# Patient Record
Sex: Male | Born: 1985 | Race: Black or African American | Hispanic: No | Marital: Single | Smoking: Current every day smoker
Health system: Southern US, Community
[De-identification: ages and names within clinical notes are randomized; demographics above are authoritative.]

## PROBLEM LIST (undated history)

## (undated) DIAGNOSIS — J45909 Unspecified asthma, uncomplicated: Secondary | ICD-10-CM

## (undated) HISTORY — PX: HERNIA REPAIR: SHX51

---

## 2016-09-23 ENCOUNTER — Emergency Department (HOSPITAL_COMMUNITY)
Admission: EM | Admit: 2016-09-23 | Discharge: 2016-09-23 | Disposition: A | Discharge status: Home / Self Care | Payer: Self-pay | Attending: Emergency Medicine | Admitting: Emergency Medicine

## 2016-09-23 ENCOUNTER — Emergency Department (HOSPITAL_COMMUNITY): Payer: Self-pay

## 2016-09-23 DIAGNOSIS — R0602 Shortness of breath: Secondary | ICD-10-CM | POA: Insufficient documentation

## 2016-09-23 LAB — CBC
HCT: 40.7 % (ref 39.0–52.0)
Hemoglobin: 13.2 g/dL (ref 13.0–17.0)
MCH: 28.7 pg (ref 26.0–34.0)
MCHC: 32.4 g/dL (ref 30.0–36.0)
MCV: 88.5 fL (ref 78.0–100.0)
PLATELETS: 422 10*3/uL — AB (ref 150–400)
RBC: 4.6 MIL/uL (ref 4.22–5.81)
RDW: 12.7 % (ref 11.5–15.5)
WBC: 9.9 10*3/uL (ref 4.0–10.5)

## 2016-09-23 LAB — BASIC METABOLIC PANEL
Anion gap: 12 (ref 5–15)
BUN: 9 mg/dL (ref 6–20)
CALCIUM: 9.3 mg/dL (ref 8.9–10.3)
CO2: 22 mmol/L (ref 22–32)
CREATININE: 1.18 mg/dL (ref 0.61–1.24)
Chloride: 101 mmol/L (ref 101–111)
GFR calc non Af Amer: >60 mL/min (ref >60)
Glucose, Bld: 116 mg/dL — ABNORMAL HIGH (ref 65–99)
Potassium: 3.1 mmol/L — ABNORMAL LOW (ref 3.5–5.1)
SODIUM: 135 mmol/L (ref 135–145)

## 2016-09-23 LAB — I-STAT TROPONIN, ED: TROPONIN I, POC: 0 ng/mL (ref 0.00–0.08)

## 2016-09-23 MED ORDER — ALBUTEROL SULFATE HFA 108 (90 BASE) MCG/ACT IN AERS
2.0000 | INHALATION_SPRAY | RESPIRATORY_TRACT | Status: DC | PRN
  Administered 2016-09-23: 2 via RESPIRATORY_TRACT
  Filled 2016-09-23: qty 6.7

## 2016-09-23 MED ORDER — IOPAMIDOL (ISOVUE-370) INJECTION 76%
INTRAVENOUS | Status: AC
  Administered 2016-09-23: 100 mL via INTRAVENOUS
  Filled 2016-09-23: qty 100

## 2016-09-23 NOTE — ED Notes (Signed)
Patient still waiting for CT.

## 2016-09-23 NOTE — Discharge Instructions (Signed)
Albuterol inhaler: 2 puffs every 4 hours as needed for wheezing or difficulty breathing.  Follow-up with your primary Dr. if not improving in the next week, and return to the ER if your symptoms significantly worsen or change.
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
## 2016-09-23 NOTE — ED Provider Notes (Signed)
[facility]EM[facility]ENCY DEPT Provider Note   CSN: [id number] Arrival date & time: 09/23/16  0130     History   Chief Complaint Chief Complaint  Patient presents with  . Chest Pain    HPI Alex Ortiz is a 31 y.o. male.  Patient is a 31 year old male with no significant past medical history. He presents today for evaluation of left-sided chest pain. This started yesterday evening in the absence of any injury or trauma. He tells me he developed shortness of breath to the point where he was on all fours on the floor gasping for air and struggling to breathe. He denies any fevers or chills. He denies any productive cough.   The history is provided by the patient.  Chest Pain   This is a new problem. The current episode started 3 to 5 hours ago. The problem occurs constantly. The problem has been gradually improving. The pain is present in the lateral region. The pain is moderate. The quality of the pain is described as sharp. Associated symptoms include shortness of breath. Pertinent negatives include no cough and no fever. He has tried nothing for the symptoms.    No past medical history on file.  There are no active problems to display for this patient.   No past surgical history on file.     Home Medications    Prior to Admission medications   Not on File    Family History No family history on file.  Social History Social History  Substance Use Topics  . Smoking status: Not on file  . Smokeless tobacco: Not on file  . Alcohol use Not on file     Allergies   Patient has no allergy information on record.   Review of Systems Review of Systems  Constitutional: Negative for fever.  Respiratory: Positive for shortness of breath. Negative for cough.   Cardiovascular: Positive for chest pain.  All other systems reviewed and are negative.    Physical Exam Updated Vital Signs BP 134/78   Pulse 63   Temp 98.7 F (37.1 C) (Oral)   Resp 12   SpO2 95%   Physical  Exam  Constitutional: He is oriented to person, place, and time. He appears well-developed and well-nourished. No distress.  HENT:  Head: Normocephalic and atraumatic.  Mouth/Throat: Oropharynx is clear and moist.  Neck: Normal range of motion. Neck supple.  Cardiovascular: Normal rate and regular rhythm.  Exam reveals no friction rub.   No murmur heard. Pulmonary/Chest: Effort normal and breath sounds normal. No respiratory distress. He has no wheezes. He has no rales. He exhibits tenderness.  There is tenderness to palpation of the left lateral chest wall and left anterior chest wall. There is no crepitus.  Abdominal: Soft. Bowel sounds are normal. He exhibits no distension. There is no tenderness.  Musculoskeletal: Normal range of motion. He exhibits no edema.  Neurological: He is alert and oriented to person, place, and time. Coordination normal.  Skin: Skin is warm and dry. He is not diaphoretic.  Nursing note and vitals reviewed.    ED Treatments / Results  Labs (all labs ordered are listed, but only abnormal results are displayed) Labs Reviewed  BASIC METABOLIC PANEL - Abnormal; Notable for the following:       Result Value   Potassium 3.1 (*)    Glucose, Bld 116 (*)    All other components within normal limits  CBC - Abnormal; Notable for the following:    Platelets 422 (*)  All other components within normal limits  I-STAT TROPOININ, ED    EKG  EKG Interpretation  Date/Time:  Thursday September 23 2016 01:34:14 EDT Ventricular Rate:  81 PR Interval:  148 QRS Duration: 86 QT Interval:  364 QTC Calculation: 422 R Axis:   57 Text Interpretation:  Normal sinus rhythm Septal infarct , age undetermined Abnormal ECG Confirmed by Demika Langenderfer  MD, Corbitt Cloke ([id number]) on 09/23/2016 4:27:30 AM       Radiology Dg Chest 2 View  Result Date: 09/23/2016 CLINICAL DATA:  Chest pain and dyspnea x1 day EXAM: CHEST  2 VIEW COMPARISON:  None. FINDINGS: The heart size and mediastinal contours  are within normal limits. Both lungs are clear. The visualized skeletal structures are unremarkable. IMPRESSION: No active cardiopulmonary disease. Electronically Signed   By: Tollie Eth M.D.   On: 09/23/2016 02:42    Procedures Procedures (including critical care time)  Medications Ordered in ED Medications - No data to display   Initial Impression / Assessment and Plan / ED Course  I have reviewed the triage vital signs and the nursing notes.  Pertinent labs & imaging results that were available during my care of the patient were reviewed by me and considered in my medical decision making (see chart for details).  Patient presents here with complaints of chest pain and shortness of breath that started earlier this evening. He reports at one point he was on the floor, "gasping for air". He appears quite comfortable now with no tachycardia, no hypoxia, and EKG and troponin which are negative. His chest x-ray is clear. Due to the severity of patient's reported symptoms, he has undergone a CT scan of the chest. This shows no evidence of pulmonary embolism. At this point, he will be discharged with an albuterol inhaler should his symptoms recur and when necessary follow-up.  Final Clinical Impressions(s) / ED Diagnoses   Final diagnoses:  None    New Prescriptions New Prescriptions   No medications on file     Geoffery Lyons, MD 09/23/16 0800

## 2016-09-23 NOTE — ED Notes (Signed)
Patient to CT.
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
## 2016-09-29 ENCOUNTER — Emergency Department (HOSPITAL_COMMUNITY)
Admission: EM | Admit: 2016-09-29 | Discharge: 2016-09-29 | Disposition: A | Discharge status: Home / Self Care | Payer: No Typology Code available for payment source | Attending: Emergency Medicine | Admitting: Emergency Medicine

## 2016-09-29 ENCOUNTER — Emergency Department (HOSPITAL_COMMUNITY): Payer: No Typology Code available for payment source

## 2016-09-29 DIAGNOSIS — Y939 Activity, unspecified: Secondary | ICD-10-CM | POA: Insufficient documentation

## 2016-09-29 DIAGNOSIS — Y92410 Unspecified street and highway as the place of occurrence of the external cause: Secondary | ICD-10-CM | POA: Diagnosis not present

## 2016-09-29 DIAGNOSIS — M542 Cervicalgia: Secondary | ICD-10-CM | POA: Diagnosis not present

## 2016-09-29 DIAGNOSIS — Y999 Unspecified external cause status: Secondary | ICD-10-CM | POA: Insufficient documentation

## 2016-09-29 DIAGNOSIS — S0990XA Unspecified injury of head, initial encounter: Secondary | ICD-10-CM | POA: Diagnosis not present

## 2016-09-29 DIAGNOSIS — M7918 Myalgia, other site: Secondary | ICD-10-CM

## 2016-09-29 MED ORDER — METHOCARBAMOL 500 MG PO TABS: 500.0000 mg | ORAL_TABLET | Freq: Two times a day (BID) | ORAL | 0 refills | Status: DC

## 2016-09-29 MED ORDER — IBUPROFEN 600 MG PO TABS: 600.0000 mg | ORAL_TABLET | Freq: Four times a day (QID) | ORAL | 0 refills | Status: DC | PRN

## 2016-09-29 MED ORDER — IBUPROFEN 400 MG PO TABS
600.0000 mg | ORAL_TABLET | Freq: Once | ORAL | Status: AC
  Administered 2016-09-29: 07:00:00 600 mg via ORAL
  Filled 2016-09-29: qty 1

## 2016-09-29 MED ORDER — METHOCARBAMOL 500 MG PO TABS
500.0000 mg | ORAL_TABLET | Freq: Once | ORAL | Status: AC
  Administered 2016-09-29: 500 mg via ORAL
  Filled 2016-09-29: qty 1
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
## 2016-09-29 NOTE — ED Notes (Signed)
Patient transported to CT SCAN . 

## 2017-04-18 DIAGNOSIS — Z5321 Procedure and treatment not carried out due to patient leaving prior to being seen by health care provider: Secondary | ICD-10-CM | POA: Insufficient documentation

## 2017-04-19 ENCOUNTER — Emergency Department (HOSPITAL_COMMUNITY)
Admission: EM | Admit: 2017-04-19 | Discharge: 2017-04-19 | Disposition: A | Discharge status: Home / Self Care | Payer: Self-pay | Attending: Emergency Medicine | Admitting: Emergency Medicine

## 2017-04-19 ENCOUNTER — Encounter (HOSPITAL_COMMUNITY): Payer: Self-pay | Admitting: Emergency Medicine

## 2017-04-19 HISTORY — DX: Unspecified asthma, uncomplicated: J45.909

## 2017-04-19 NOTE — ED Notes (Signed)
Pt up to nurse first desk stating he is leaving. LWBS after triage

## 2017-04-19 NOTE — ED Triage Notes (Signed)
Pt reports body aches, HA, chills, nasal congestion X3 days.

## 2018-10-13 ENCOUNTER — Inpatient Hospital Stay (HOSPITAL_COMMUNITY)
Admission: EM | Admit: 2018-10-13 | Discharge: 2018-10-19 | DRG: 246 | Disposition: A | Discharge status: Home / Self Care | Payer: Self-pay | Attending: Cardiovascular Disease | Admitting: Cardiovascular Disease

## 2018-10-13 ENCOUNTER — Other Ambulatory Visit: Payer: Self-pay

## 2018-10-13 ENCOUNTER — Emergency Department (HOSPITAL_COMMUNITY): Payer: Self-pay

## 2018-10-13 ENCOUNTER — Encounter (HOSPITAL_COMMUNITY): Payer: Self-pay | Admitting: Emergency Medicine

## 2018-10-13 DIAGNOSIS — J45909 Unspecified asthma, uncomplicated: Secondary | ICD-10-CM | POA: Diagnosis present

## 2018-10-13 DIAGNOSIS — Z8249 Family history of ischemic heart disease and other diseases of the circulatory system: Secondary | ICD-10-CM

## 2018-10-13 DIAGNOSIS — I25110 Atherosclerotic heart disease of native coronary artery with unstable angina pectoris: Secondary | ICD-10-CM | POA: Diagnosis present

## 2018-10-13 DIAGNOSIS — I214 Non-ST elevation (NSTEMI) myocardial infarction: Principal | ICD-10-CM | POA: Diagnosis present

## 2018-10-13 DIAGNOSIS — I5021 Acute systolic (congestive) heart failure: Secondary | ICD-10-CM | POA: Diagnosis present

## 2018-10-13 DIAGNOSIS — R778 Other specified abnormalities of plasma proteins: Secondary | ICD-10-CM

## 2018-10-13 DIAGNOSIS — Z1159 Encounter for screening for other viral diseases: Secondary | ICD-10-CM

## 2018-10-13 DIAGNOSIS — Z955 Presence of coronary angioplasty implant and graft: Secondary | ICD-10-CM

## 2018-10-13 DIAGNOSIS — E785 Hyperlipidemia, unspecified: Secondary | ICD-10-CM | POA: Diagnosis present

## 2018-10-13 DIAGNOSIS — I208 Other forms of angina pectoris: Secondary | ICD-10-CM | POA: Diagnosis present

## 2018-10-13 DIAGNOSIS — R079 Chest pain, unspecified: Secondary | ICD-10-CM | POA: Diagnosis present

## 2018-10-13 DIAGNOSIS — I2510 Atherosclerotic heart disease of native coronary artery without angina pectoris: Secondary | ICD-10-CM | POA: Diagnosis present

## 2018-10-13 DIAGNOSIS — F17210 Nicotine dependence, cigarettes, uncomplicated: Secondary | ICD-10-CM | POA: Diagnosis present

## 2018-10-13 LAB — BASIC METABOLIC PANEL
Anion gap: 8 (ref 5–15)
BUN: 13 mg/dL (ref 6–20)
CO2: 26 mmol/L (ref 22–32)
Calcium: 9.5 mg/dL (ref 8.9–10.3)
Chloride: 105 mmol/L (ref 98–111)
Creatinine, Ser: 1.13 mg/dL (ref 0.61–1.24)
GFR calc Af Amer: >60 mL/min (ref >60)
GFR calc non Af Amer: >60 mL/min (ref >60)
Glucose, Bld: 122 mg/dL — ABNORMAL HIGH (ref 70–99)
Potassium: 3.9 mmol/L (ref 3.5–5.1)
Sodium: 139 mmol/L (ref 135–145)

## 2018-10-13 LAB — TROPONIN I
Troponin I: 0.03 ng/mL (ref <0.03)
Troponin I: 0.09 ng/mL (ref <0.03)

## 2018-10-13 LAB — CBC
HCT: 45.4 % (ref 39.0–52.0)
Hemoglobin: 14.2 g/dL (ref 13.0–17.0)
MCH: 28.7 pg (ref 26.0–34.0)
MCHC: 31.3 g/dL (ref 30.0–36.0)
MCV: 91.9 fL (ref 80.0–100.0)
Platelets: 440 10*3/uL — ABNORMAL HIGH (ref 150–400)
RBC: 4.94 MIL/uL (ref 4.22–5.81)
RDW: 12.4 % (ref 11.5–15.5)
WBC: 5.4 10*3/uL (ref 4.0–10.5)
nRBC: 0 % (ref 0.0–0.2)

## 2018-10-13 LAB — SARS CORONAVIRUS 2 BY RT PCR (HOSPITAL ORDER, PERFORMED IN ~~LOC~~ HOSPITAL LAB): SARS Coronavirus 2: NEGATIVE

## 2018-10-13 LAB — RAPID URINE DRUG SCREEN, HOSP PERFORMED
Amphetamines: NOT DETECTED
Barbiturates: NOT DETECTED
Benzodiazepines: NOT DETECTED
Cocaine: NOT DETECTED
Opiates: NOT DETECTED
Tetrahydrocannabinol: POSITIVE — AB

## 2018-10-13 MED ORDER — SODIUM CHLORIDE 0.9% FLUSH
3.0000 mL | Freq: Once | INTRAVENOUS | Status: AC
  Administered 2018-10-13: 3 mL via INTRAVENOUS

## 2018-10-13 MED ORDER — FENTANYL CITRATE (PF) 100 MCG/2ML IJ SOLN
50.0000 ug | Freq: Once | INTRAMUSCULAR | Status: AC
  Administered 2018-10-13: 50 ug via INTRAVENOUS
  Filled 2018-10-13: qty 2

## 2018-10-13 MED ORDER — IOHEXOL 350 MG/ML SOLN
100.0000 mL | Freq: Once | INTRAVENOUS | Status: AC | PRN
  Administered 2018-10-13: 100 mL via INTRAVENOUS

## 2018-10-13 MED ORDER — MORPHINE SULFATE (PF) 4 MG/ML IV SOLN
4.0000 mg | Freq: Once | INTRAVENOUS | Status: AC
  Administered 2018-10-13: 18:00:00 4 mg via INTRAVENOUS
  Filled 2018-10-13: qty 1

## 2018-10-13 MED ORDER — LIDOCAINE VISCOUS HCL 2 % MT SOLN
15.0000 mL | Freq: Once | OROMUCOSAL | Status: AC
  Administered 2018-10-13: 15 mL via ORAL
  Filled 2018-10-13: qty 15

## 2018-10-13 MED ORDER — KETOROLAC TROMETHAMINE 30 MG/ML IJ SOLN
30.0000 mg | Freq: Once | INTRAMUSCULAR | Status: AC
  Administered 2018-10-13: 30 mg via INTRAVENOUS
  Filled 2018-10-13: qty 1

## 2018-10-13 MED ORDER — ALUM & MAG HYDROXIDE-SIMETH 200-200-20 MG/5ML PO SUSP
30.0000 mL | Freq: Once | ORAL | Status: AC
  Administered 2018-10-13: 30 mL via ORAL
  Filled 2018-10-13: qty 30

## 2018-10-13 MED ORDER — FAMOTIDINE 20 MG PO TABS
20.0000 mg | ORAL_TABLET | Freq: Once | ORAL | Status: AC
  Administered 2018-10-13: 20 mg via ORAL
  Filled 2018-10-13: qty 1

## 2018-10-13 MED ORDER — ASPIRIN 325 MG PO TABS
325.0000 mg | ORAL_TABLET | Freq: Once | ORAL | Status: AC
  Administered 2018-10-13: 325 mg via ORAL
  Filled 2018-10-13: qty 1

## 2018-10-13 NOTE — ED Notes (Signed)
Patient transported to x-ray. ?
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
## 2018-10-13 NOTE — ED Notes (Signed)
Pt given water 

## 2018-10-13 NOTE — ED Notes (Signed)
EDP made aware of troponin level

## 2018-10-13 NOTE — ED Provider Notes (Signed)
[facility] EMERGENCY DEPARTMENT Provider Note   CSN: [id number] Arrival date & time: 10/13/18  1639    History   Chief Complaint Chief Complaint  Patient presents with   Chest Pain    HPI Alex Ortiz is a 33 y.o. male.     Alex Ortiz is a 33 y.o. male with a history of asthma, otherwise healthy, who presents to the emergency department for evaluation of chest pain.  Patient reports that chest pain started suddenly this morning while he was playing video games.  He reports pain is a sharp pain present over the middle and left side of the chest.  It is constant in nature.  It is nonradiating.  Pain is not worse with exertion.  He does report pain seems to worsen with movement but not with particular position.  He denies any numbness or weakness in any of his extremities.  No associated abdominal pain, nausea vomiting or diaphoresis.  No similar episodes of chest pain.  He does report recent exercise and weight lifting, most recently last night, but does not have generalized myalgias elsewhere.  Pain is only localized to the chest.  He denies any personal history of cardiac issues.  Family history of CAD but only in older family members.  He denies cocaine use, admits to marijuana but denies other drugs.  Reports occasional alcohol use.  No other cardiac risk factors.     Past Medical History:  Diagnosis Date   Asthma     There are no active problems to display for this patient.   Past Surgical History:  Procedure Laterality Date   HERNIA REPAIR          Home Medications    Prior to Admission medications   Not on File    Family History No family history on file.  Social History Social History   Tobacco Use   Smoking status: Current Every Day Smoker   Smokeless tobacco: Never Used  Substance Use Topics   Alcohol use: Yes    Comment: socially    Drug use: No     Allergies   Patient has no known allergies.   Review of  Systems Review of Systems  Constitutional: Negative for chills and fever.  HENT: Negative.   Eyes: Negative for visual disturbance.  Respiratory: Negative for cough, chest tightness, shortness of breath and wheezing.   Cardiovascular: Positive for chest pain. Negative for palpitations and leg swelling.  Gastrointestinal: Negative for abdominal pain, nausea and vomiting.  Genitourinary: Negative for dysuria.  Musculoskeletal: Negative for arthralgias and back pain.  Skin: Negative for color change and rash.  Neurological: Negative for dizziness, syncope and light-headedness.     Physical Exam Updated Vital Signs BP 128/74 (BP Location: Right Arm)    Pulse 76    Temp 99.1 F (37.3 C) (Oral)    Resp 16    Ht  (1.727 m)    Wt 84.8 kg    SpO2 98%    BMI 28.43 kg/m   Physical Exam Vitals signs and nursing note reviewed.  Constitutional:      General: He is not in acute distress.    Appearance: He is well-developed and normal weight. He is not diaphoretic.     Comments: Patient appears uncomfortable but is in no acute distress, smells of marijuana  HENT:     Head: Normocephalic and atraumatic.  Eyes:     General:        Right eye: No  discharge.        Left eye: No discharge.     Pupils: Pupils are equal, round, and reactive to light.  Neck:     Musculoskeletal: Neck supple.  Cardiovascular:     Rate and Rhythm: Normal rate and regular rhythm.     Pulses:          Radial pulses are 2+ on the right side and 2+ on the left side.       Dorsalis pedis pulses are 2+ on the right side and 2+ on the left side.     Heart sounds: Normal heart sounds. No murmur. No friction rub. No gallop.   Pulmonary:     Effort: Pulmonary effort is normal. No respiratory distress.     Breath sounds: Normal breath sounds. No wheezing or rales.     Comments: Respirations equal and unlabored, patient able to speak in full sentences, lungs clear to auscultation bilaterally Chest:     Comments: Chest  pain is not reproducible with palpation.  No overlying skin changes or deformity noted over the chest wall. Abdominal:     General: Bowel sounds are normal. There is no distension.     Palpations: Abdomen is soft. There is no mass.     Tenderness: There is no abdominal tenderness. There is no guarding.     Comments: Abdomen soft, nondistended, nontender to palpation in all quadrants without guarding or peritoneal signs  Musculoskeletal:        General: No deformity.     Right lower leg: He exhibits no tenderness. No edema.     Left lower leg: He exhibits no tenderness. No edema.  Skin:    General: Skin is warm and dry.     Capillary Refill: Capillary refill takes less than 2 seconds.  Neurological:     Mental Status: He is alert.     Coordination: Coordination normal.     Comments: Speech is clear, able to follow commands CN III-XII intact Normal strength in upper and lower extremities bilaterally including dorsiflexion and plantar flexion, strong and equal grip strength Sensation normal to light and sharp touch Moves extremities without ataxia, coordination intact  Psychiatric:        Mood and Affect: Mood normal.        Behavior: Behavior normal.      ED Treatments / Results  Labs (all labs ordered are listed, but only abnormal results are displayed) Labs Reviewed  BASIC METABOLIC PANEL - Abnormal; Notable for the following components:      Result Value   Glucose, Bld 122 (*)    All other components within normal limits  CBC - Abnormal; Notable for the following components:   Platelets 440 (*)    All other components within normal limits  TROPONIN I - Abnormal; Notable for the following components:   Troponin I 0.03 (*)    All other components within normal limits  RAPID URINE DRUG SCREEN, HOSP PERFORMED - Abnormal; Notable for the following components:   Tetrahydrocannabinol POSITIVE (*)    All other components within normal limits  TROPONIN I - Abnormal; Notable for  the following components:   Troponin I 0.09 (*)    All other components within normal limits  SARS CORONAVIRUS 2 ([facility]PITAL [facility])  TROPONIN I    EKG EKG Interpretation  Date/Time:  Friday Oct 13 2018 16:44:40 EDT Ventricular Rate:  83 PR Interval:  126 QRS Duration: 90 QT Interval:  346 QTC Calculation: 406 R Axis:   41 Text Interpretation:  Normal sinus rhythm with sinus arrhythmia Poor baseline Confirmed by Blane Ohara [phone]) on 10/13/2018 5:14:31 PM   Radiology Dg Chest 2 View  Result Date: 10/13/2018 CLINICAL DATA:  Chest pain. EXAM: CHEST - 2 VIEW COMPARISON:  Chest x-ray and CT chest dated September 23, 2016. FINDINGS: The heart size and mediastinal contours are within normal limits. Both lungs are clear. The visualized skeletal structures are unremarkable. IMPRESSION: No active cardiopulmonary disease. Electronically Signed   By: Obie Dredge M.D.   On: 10/13/2018 19:19   Ct Angio Chest/abd/pel For Dissection W And/or Wo Contrast  Result Date: 10/13/2018 CLINICAL DATA:  Chest and abdomen pain EXAM: CT ANGIOGRAPHY CHEST, ABDOMEN AND PELVIS TECHNIQUE: Initially, axial CT images were obtained through the chest without intravenous contrast material administration. Multidetector CT imaging through the chest, abdomen and pelvis was performed using the standard protocol during bolus administration of intravenous contrast. Multiplanar reconstructed images and MIPs were obtained and reviewed to evaluate the vascular anatomy. CONTRAST:  OMNIPAQUE IOHEXOL 350 MG/ML SOLN COMPARISON:  Chest CT September 23, 2016; chest radiograph Oct 13, 2018 FINDINGS: CTA CHEST FINDINGS Cardiovascular: There is no intramural hematoma within the thoracic aorta on the noncontrast enhanced study. There is no demonstrable thoracic aortic aneurysm or dissection. The visualized great vessels appear unremarkable. Note that the right innominate and left common carotid arteries  arise as a common trunk, an anatomic variant. No pulmonary embolus is appreciable. There is no pericardial effusion or pericardial thickening evident. Mediastinum/Nodes: Thyroid appears unremarkable. There is an enlarged right hilar lymph node measuring 1.4 x 1.4 cm. There are scattered subcentimeter mediastinal lymph nodes and axillary lymph nodes. No other lymph node enlargement is evident. There is a small hiatal hernia. Lungs/Pleura: There is mild bibasilar atelectasis. No pneumothorax. No edema or consolidation. No pleural effusion or pleural thickening evident. Musculoskeletal: There are no blastic or lytic bone lesions. No evident fracture or dislocation. No chest wall lesions evident. Review of the MIP images confirms the above findings. CTA ABDOMEN AND PELVIS FINDINGS VASCULAR Aorta: There is no abdominal aortic aneurysm or dissection. No appreciable atherosclerotic change. Celiac: Celiac artery and its branches appear widely patent. No aneurysm or dissection evident. SMA: Superior mesenteric artery and its branches appear widely patent. No aneurysm or dissection evident. Renals: There are 2 renal arteries on each side, with one of the arteries on each side significantly larger than the other. Renal arteries bilaterally appear widely patent in all segments. No aneurysm or dissection. No fibromuscular dysplasia evident on either side. IMA: Inferior mesenteric artery in its branches are widely patent. No aneurysm or dissection evident. Inflow: The major pelvic arterial vessels are widely patent throughout their respective courses. No appreciable atherosclerotic change noted. No aneurysm or dissection involving major pelvic arterial vessels. Proximal superficial and profunda femoral arteries are also widely patent without aneurysm or dissection. Veins: No obvious venous abnormality within the limitations of this arterial phase study. Review of the MIP images confirms the above findings. NON-VASCULAR  Hepatobiliary: No focal liver lesions are evident. The gallbladder wall is not appreciably thickened. There is no biliary duct dilatation. Pancreas: There is no pancreatic mass or inflammatory focus. Spleen: No splenic lesions are evident. Adrenals/Urinary Tract: Adrenals bilaterally appear normal. Kidneys bilaterally show no evident mass or hydronephrosis on either side. There is no appreciable renal or ureteral calculus. Urinary bladder is midline with wall thickness within normal limits. Stomach/Bowel: There is no appreciable  bowel wall or mesenteric thickening. No evident bowel obstruction. Terminal ileum appears unremarkable. No free air or portal venous air evident. Lymphatic: There is no evident adenopathy in the abdomen or pelvis. Reproductive: Prostate and seminal vesicles appear normal in size and contour. No evident pelvic mass. Other: The appendix appears normal. There is no abscess or ascites in the abdomen or pelvis. Musculoskeletal: No evident fracture or dislocation. No blastic or lytic bone lesions. No intramuscular or abdominal wall lesions evident. Review of the MIP images confirms the above findings. IMPRESSION: CT angiogram chest: 1. No thoracic aortic aneurysm or dissection. No intramural hematoma. 2.  No demonstrable pulmonary embolus. 3.  Areas of mild atelectasis.  No lung edema or consolidation. 4. Enlarged right hilar lymph node. No other adenopathy evident. Etiology for this single enlarged lymph node is uncertain. 5.  Small hiatal hernia. CT angiogram abdomen; CT angiogram pelvis: 1. No aneurysm or dissection involving the aorta, major pelvic, or major mesenteric vessels. No appreciable obstructive disease. No appreciable atherosclerotic calcification. No fibromuscular dysplasia. 2. No evident bowel wall thickening or bowel obstruction. No abscess in the abdomen pelvis. Appendix appears normal. 3. No renal or ureteral calculus. No hydronephrosis on either side. Urinary bladder wall  thickness is within normal limits. Electronically Signed   By: Bretta Bang III M.D.   On: 10/13/2018 19:57    Procedures Procedures (including critical care time)  Medications Ordered in ED Medications  sodium chloride flush (NS) 0.9 % injection 3 mL (3 mLs Intravenous Given 10/13/18 1747)  morphine 4 MG/ML injection 4 mg (4 mg Intravenous Given 10/13/18 1802)  aspirin tablet 325 mg (325 mg Oral Given 10/13/18 1804)  alum & mag hydroxide-simeth (MAALOX/MYLANTA) 200-200-20 MG/5ML suspension 30 mL (30 mLs Oral Given 10/13/18 1805)    And  lidocaine (XYLOCAINE) 2 % viscous mouth solution 15 mL (15 mLs Oral Given 10/13/18 1805)  famotidine (PEPCID) tablet 20 mg (20 mg Oral Given 10/13/18 1804)  fentaNYL (SUBLIMAZE) injection 50 mcg (50 mcg Intravenous Given 10/13/18 1947)  iohexol (OMNIPAQUE) 350 MG/ML injection 100 mL (100 mLs Intravenous Contrast Given 10/13/18 1941)     Initial Impression / Assessment and Plan / ED Course  I have reviewed the triage vital signs and the nursing notes.  Pertinent labs & imaging results that were available during my care of the patient were reviewed by me and considered in my medical decision making (see chart for details).  Presents with central and left-sided chest pain which began this morning, described as sharp and constant.  Pain is nonradiating, pain is nonexertional.  He denies associated shortness of breath.  No lower extremity swelling.  Pain came on suddenly.  No associated shortness of breath, syncope, abdominal pain, nausea vomiting or diaphoresis.  Pain is fairly atypical and patient does not have significant risk factors for ACS.  He does appear uncomfortable.  Does not have obvious risk factors for dissection and does not have unilateral pulse differences but if pain is not controlled dissection should be considered.  I have extremely low suspicion for PE and patient is PERC negative.  He does not have any belly tenderness, he does report a burning  characteristic of his pain, wonder if GERD could be contributing.  He does report that he has recently been doing a lot of weight lifting but pain is not reproducible with palpation.  He does not have diffuse myalgias elsewhere to suggest rhabdo.  Will check basic labs, troponin, EKG and chest x-ray.  Morphine a  GI cocktail for pain as well as aspirin.  EKG shows normal sinus rhythm with sinus arrhythmia, initial troponin is minimally elevated at 0.03, given that story is so atypical we will plan to trend troponin.  After initial pain medications patient reports no improvement in his pain.  His labs are reassuring and his chest x-ray is clear but given persistent acute pain will get dissection study.  UDS is negative for cocaine.  Dissection study is unremarkable, no evidence of dissection or PE.  There is some right hilar lymphadenopathy, no other findings reported.  Patient second troponin despite reassuring work-up has continued to elevate and is now 0.09, case was discussed with Dr. Vallery Ridge, cardiology fellow who recommends that given atypical story that patient have third troponin run if this is within the range of the other 2 he can likely be discharged with close outpatient follow-up but if it continues to elevate patient will likely need to be admitted.  Third troponin returns at 0.31.  Discussed again with Dr. Charna Busman who will see the patient in consult but recommends medicine admission for continued trending of troponins and echo in the morning.  Does not recommend heparinizing at this time given atypical story.  Case discussed with Dr. Allena Katz with [facility] hospital[facility] who will see and admit the patient.  Final Clinical Impressions(s) / ED Diagnoses   Final diagnoses:  Central chest pain  Elevated troponin    ED Discharge Orders    None       Legrand Rams 10/14/18 0034    Tilden Fossa, MD 10/17/18 0730

## 2018-10-14 ENCOUNTER — Observation Stay (HOSPITAL_BASED_OUTPATIENT_CLINIC_OR_DEPARTMENT_OTHER): Payer: Self-pay

## 2018-10-14 ENCOUNTER — Encounter (HOSPITAL_COMMUNITY): Payer: Self-pay | Admitting: Internal Medicine

## 2018-10-14 DIAGNOSIS — E785 Hyperlipidemia, unspecified: Secondary | ICD-10-CM | POA: Diagnosis present

## 2018-10-14 DIAGNOSIS — R079 Chest pain, unspecified: Secondary | ICD-10-CM

## 2018-10-14 DIAGNOSIS — R7989 Other specified abnormal findings of blood chemistry: Secondary | ICD-10-CM

## 2018-10-14 DIAGNOSIS — R0789 Other chest pain: Secondary | ICD-10-CM

## 2018-10-14 DIAGNOSIS — R778 Other specified abnormalities of plasma proteins: Secondary | ICD-10-CM | POA: Insufficient documentation

## 2018-10-14 DIAGNOSIS — I214 Non-ST elevation (NSTEMI) myocardial infarction: Secondary | ICD-10-CM | POA: Diagnosis present

## 2018-10-14 DIAGNOSIS — J45909 Unspecified asthma, uncomplicated: Secondary | ICD-10-CM

## 2018-10-14 LAB — BASIC METABOLIC PANEL
Anion gap: 12 (ref 5–15)
BUN: 8 mg/dL (ref 6–20)
CO2: 23 mmol/L (ref 22–32)
Calcium: 9.5 mg/dL (ref 8.9–10.3)
Chloride: 101 mmol/L (ref 98–111)
Creatinine, Ser: 1.06 mg/dL (ref 0.61–1.24)
GFR calc Af Amer: >60 mL/min (ref >60)
GFR calc non Af Amer: >60 mL/min (ref >60)
Glucose, Bld: 108 mg/dL — ABNORMAL HIGH (ref 70–99)
Potassium: 3.9 mmol/L (ref 3.5–5.1)
Sodium: 136 mmol/L (ref 135–145)

## 2018-10-14 LAB — LIPID PANEL
Cholesterol: 186 mg/dL (ref 0–200)
HDL: 45 mg/dL (ref >40)
LDL Cholesterol: 127 mg/dL — ABNORMAL HIGH (ref 0–99)
Total CHOL/HDL Ratio: 4.1 RATIO
Triglycerides: 70 mg/dL (ref <150)
VLDL: 14 mg/dL (ref 0–40)

## 2018-10-14 LAB — CBC
HCT: 45.5 % (ref 39.0–52.0)
Hemoglobin: 14.8 g/dL (ref 13.0–17.0)
MCH: 29.2 pg (ref 26.0–34.0)
MCHC: 32.5 g/dL (ref 30.0–36.0)
MCV: 89.9 fL (ref 80.0–100.0)
Platelets: 412 10*3/uL — ABNORMAL HIGH (ref 150–400)
RBC: 5.06 MIL/uL (ref 4.22–5.81)
RDW: 12.3 % (ref 11.5–15.5)
WBC: 9.9 10*3/uL (ref 4.0–10.5)
nRBC: 0 % (ref 0.0–0.2)

## 2018-10-14 LAB — ECHOCARDIOGRAM COMPLETE
Height: 68 in
Weight: 2966.4 oz

## 2018-10-14 LAB — HIV ANTIBODY (ROUTINE TESTING W REFLEX): HIV Screen 4th Generation wRfx: NONREACTIVE

## 2018-10-14 LAB — TROPONIN I: Troponin I: 0.31 ng/mL (ref <0.03)

## 2018-10-14 MED ORDER — ONDANSETRON HCL 4 MG/2ML IJ SOLN: 4.0000 mg | Freq: Four times a day (QID) | INTRAMUSCULAR | Status: DC | PRN

## 2018-10-14 MED ORDER — PANTOPRAZOLE SODIUM 40 MG PO TBEC
40.0000 mg | DELAYED_RELEASE_TABLET | Freq: Every day | ORAL | Status: DC
  Administered 2018-10-14 – 2018-10-19 (×6): 40 mg via ORAL
  Filled 2018-10-14 (×6): qty 1

## 2018-10-14 MED ORDER — ENOXAPARIN SODIUM 40 MG/0.4ML ~~LOC~~ SOLN
40.0000 mg | Freq: Every day | SUBCUTANEOUS | Status: DC
  Administered 2018-10-14: 02:00:00 40 mg via SUBCUTANEOUS
  Filled 2018-10-14: qty 0.4

## 2018-10-14 MED ORDER — OXYCODONE-ACETAMINOPHEN 5-325 MG PO TABS
2.0000 | ORAL_TABLET | Freq: Once | ORAL | Status: AC
  Administered 2018-10-14: 2 via ORAL
  Filled 2018-10-14: qty 2

## 2018-10-14 MED ORDER — ALBUTEROL SULFATE (2.5 MG/3ML) 0.083% IN NEBU: 3.0000 mL | INHALATION_SOLUTION | Freq: Four times a day (QID) | RESPIRATORY_TRACT | Status: DC | PRN

## 2018-10-14 MED ORDER — ASPIRIN EC 81 MG PO TBEC
81.0000 mg | DELAYED_RELEASE_TABLET | Freq: Every day | ORAL | Status: DC
  Administered 2018-10-14 – 2018-10-19 (×6): 81 mg via ORAL
  Filled 2018-10-14 (×7): qty 1

## 2018-10-14 MED ORDER — ATORVASTATIN CALCIUM 40 MG PO TABS
40.0000 mg | ORAL_TABLET | Freq: Every day | ORAL | Status: DC
  Administered 2018-10-14 – 2018-10-15 (×2): 40 mg via ORAL
  Filled 2018-10-14 (×2): qty 1

## 2018-10-14 MED ORDER — ACETAMINOPHEN 325 MG PO TABS: 650.0000 mg | ORAL_TABLET | ORAL | Status: DC | PRN

## 2018-10-14 MED ORDER — HEPARIN (PORCINE) 25000 UT/250ML-% IV SOLN
1250.0000 [IU]/h | INTRAVENOUS | Status: DC
  Administered 2018-10-14: 1000 [IU]/h via INTRAVENOUS
  Administered 2018-10-15: 22:00:00 1250 [IU]/h via INTRAVENOUS
  Filled 2018-10-14 (×2): qty 250

## 2018-10-14 MED ORDER — HEPARIN BOLUS VIA INFUSION
2000.0000 [IU] | Freq: Once | INTRAVENOUS | Status: DC
  Filled 2018-10-14: qty 2000

## 2018-10-14 MED ORDER — METOPROLOL TARTRATE 12.5 MG HALF TABLET
12.5000 mg | ORAL_TABLET | Freq: Two times a day (BID) | ORAL | Status: DC
  Administered 2018-10-14 – 2018-10-18 (×9): 12.5 mg via ORAL
  Filled 2018-10-14 (×10): qty 1

## 2018-10-14 NOTE — Progress Notes (Signed)
Repeat EKG reviewed - there are now biphasic T waves in the anteroseptal leads with T wave inversion and ST flattening in the high lateral leads - consistent with evolving EKG pattern of MI. He remains chest pain free at this point. Continue medical therapy with plan for cath Monday.  Chrystie Nose, MD, [facility], FACP    [facility]  Medical Director of the [facility]  [facility] Diplomate of the American Board of Clinical Lipidology Attending Cardiologist  Direct Dial: [phone]  Fax: [phone]  Website:  [email or web address]

## 2018-10-14 NOTE — Progress Notes (Signed)
PROGRESS NOTE    Alex Ortiz  ZOX:[id number] DOB: December 19, 1985 DOA: 10/13/2018 PCP: Patient, No Pcp Per  Brief Narrative: 33 year old male with 3 of asthma presented to the emergency room with chest pain x1 day yesterday,  -EKG abnormal, troponin mildly elevated  Assessment & Plan:   Atypical chest pain/elevated troponin -Etiology not very clear at this time, pericarditis is a possibility -Troponin mildly elevated, trended up from 0.03->0.09 and then 0.31 -CTA with no PE, no dissection no acute pulmonary findings, isolated hilar lymph node of questionable significance -EKG with Q waves in V1 V2, LVH -Cardiology consulting, echocardiogram today -If echo normal plan to consider coronary CTA per cardiology -COVID-19 PCR was negative -Add PPI as well  History of asthma -No symptoms of asthma exacerbation, no cough congestion wheezing or shortness of breath -Albuterol as needed  DVT prophylaxis: Lovenox Code Status: Full code Family Communication: No family at bedside Disposition Plan: Home pending above cardiac work-up  Consultants:   Cardiology   Procedures:   Antimicrobials:    Subjective: -Had mild chest discomfort earlier this morning again -Denies heartburn  Objective: Vitals:   10/13/18 2230 10/13/18 2335 10/14/18 0128 10/14/18 0135  BP: (!) 136/98 116/82  (!) 134/99  Pulse: 68 63  64  Resp: 13 15    Temp:    98.2 F (36.8 C)  TempSrc:    Oral  SpO2: 99% 100%  99%  Weight:   84.1 kg   Height:    (1.727 m)     Intake/Output Summary (Last 24 hours) at 10/14/2018 1036 Last data filed at 10/14/2018 0130 Gross per 24 hour  Intake 120 ml  Output -  Net 120 ml   Filed Weights   10/13/18 1652 10/14/18 0128  Weight: 84.8 kg 84.1 kg    Examination:  General exam: Appears calm and comfortable, laying in bed, no distress Respiratory system: Decreased breath sounds at the left base, rest clear Cardiovascular system: S1 & S2 heard, RRR.  Gastrointestinal  system: Abdomen is nondistended, soft and nontender.Normal bowel sounds heard. Central nervous system: Alert and oriented. No focal neurological deficits. Extremities: No edema Skin: No rashes, lesions or ulcers Psychiatry: Judgement and insight appear normal. Mood & affect appropriate.     Data Reviewed:   CBC: Recent Labs  Lab 10/13/18 1657 10/14/18 0205  WBC 5.4 9.9  HGB 14.2 14.8  HCT 45.4 45.5  MCV 91.9 89.9  PLT 440* 412*   Basic Metabolic Panel: Recent Labs  Lab 10/13/18 1657 10/14/18 0205  NA 139 136  K 3.9 3.9  CL 105 101  CO2 26 23  GLUCOSE 122* 108*  BUN 13 8  CREATININE 1.13 1.06  CALCIUM 9.5 9.5   GFR: Estimated Creatinine Clearance: 106.7 mL/min (by C-G formula based on SCr of 1.06 mg/dL). Liver Function Tests: No results for input(s): AST, ALT, ALKPHOS, BILITOT, PROT, ALBUMIN in the last 168 hours. No results for input(s): LIPASE, AMYLASE in the last 168 hours. No results for input(s): AMMONIA in the last 168 hours. Coagulation Profile: No results for input(s): INR, PROTIME in the last 168 hours. Cardiac Enzymes: Recent Labs  Lab 10/13/18 1657 10/13/18 2007 10/13/18 2300  TROPONINI 0.03* 0.09* 0.31*   BNP (last 3 results) No results for input(s): PROBNP in the last 8760 hours. HbA1C: No results for input(s): HGBA1C in the last 72 hours. CBG: No results for input(s): GLUCAP in the last 168 hours. Lipid Profile: Recent Labs    10/14/18 0205  CHOL 186  HDL 45  LDLCALC 127*  TRIG 70  CHOLHDL 4.1   Thyroid Function Tests: No results for input(s): TSH, T4TOTAL, FREET4, T3FREE, THYROIDAB in the last 72 hours. Anemia Panel: No results for input(s): VITAMINB12, FOLATE, FERRITIN, TIBC, IRON, RETICCTPCT in the last 72 hours. Urine analysis: No results found for: COLORURINE, APPEARANCEUR, LABSPEC, PHURINE, GLUCOSEU, HGBUR, BILIRUBINUR, KETONESUR, PROTEINUR, UROBILINOGEN, NITRITE, LEUKOCYTESUR Sepsis Labs:  @[email or web address] (procalcitonin:4,lacticidven:4)  ) Recent Results (from the past 240 hour(s))  SARS Coronavirus 2 (CEPHEID - Performed in [facility] hospital lab), Hosp Order     Status: None   Collection Time: 10/13/18  9:32 PM  Result Value Ref Range Status   SARS Coronavirus 2 NEGATIVE NEGATIVE Final    Comment: (NOTE) If result is NEGATIVE SARS-CoV-2 target nucleic acids are NOT DETECTED. The SARS-CoV-2 RNA is generally detectable in upper and lower  respiratory specimens during the acute phase of infection. The lowest  concentration of SARS-CoV-2 viral copies this assay can detect is 250  copies / mL. A negative result does not preclude SARS-CoV-2 infection  and should not be used as the sole basis for treatment or other  patient management decisions.  A negative result may occur with  improper specimen collection / handling, submission of specimen other  than nasopharyngeal swab, presence of viral mutation(s) within the  areas targeted by this assay, and inadequate number of viral copies  (<250 copies / mL). A negative result must be combined with clinical  observations, patient history, and epidemiological information. If result is POSITIVE SARS-CoV-2 target nucleic acids are DETECTED. The SARS-CoV-2 RNA is generally detectable in upper and lower  respiratory specimens dur ing the acute phase of infection.  Positive  results are indicative of active infection with SARS-CoV-2.  Clinical  correlation with patient history and other diagnostic information is  necessary to determine patient infection status.  Positive results do  not rule out bacterial infection or co-infection with other viruses. If result is PRESUMPTIVE POSTIVE SARS-CoV-2 nucleic acids MAY BE PRESENT.   A presumptive positive result was obtained on the submitted specimen  and confirmed on repeat testing.  While 2019 novel coronavirus  (SARS-CoV-2) nucleic acids may be present in the submitted sample  additional  confirmatory testing may be necessary for epidemiological  and / or clinical management purposes  to differentiate between  SARS-CoV-2 and other Sarbecovirus currently known to infect humans.  If clinically indicated additional testing with an alternate test  methodology [phone]([id number]) is advised. The SARS-CoV-2 RNA is generally  detectable in upper and lower respiratory sp ecimens during the acute  phase of infection. The expected result is Negative. Fact Sheet for Patients:  BoilerBrush.com.cyhttps://www.fda.gov/media/136312/download Fact Sheet for Healthcare Providers: https://pope.com/https://www.fda.gov/media/136313/download This test is not yet approved or cleared by the [place] FDA and has been authorized for detection and/or diagnosis of SARS-CoV-2 by FDA under an Emergency Use Authorization (EUA).  This EUA will remain in effect (meaning this test can be used) for the duration of the COVID-19 declaration under Section 564(b)(1) of the Act, 21 U.S.C. section 360bbb-3(b)(1), unless the authorization is terminated or revoked sooner. Performed at [facility], [address] [place], [place] [id number]          Radiology Studies: Dg Chest 2 View  Result Date: 10/13/2018 CLINICAL DATA:  Chest pain. EXAM: CHEST - 2 VIEW COMPARISON:  Chest x-ray and CT chest dated September 23, 2016. FINDINGS: The heart size and mediastinal contours are within normal limits. Both lungs are clear. The  visualized skeletal structures are unremarkable. IMPRESSION: No active cardiopulmonary disease. Electronically Signed   By: Obie Dredge M.D.   On: 10/13/2018 19:19   Ct Angio Chest/abd/pel For Dissection W And/or Wo Contrast  Result Date: 10/13/2018 CLINICAL DATA:  Chest and abdomen pain EXAM: CT ANGIOGRAPHY CHEST, ABDOMEN AND PELVIS TECHNIQUE: Initially, axial CT images were obtained through the chest without intravenous contrast material administration. Multidetector CT imaging through the chest, abdomen and pelvis was  performed using the standard protocol during bolus administration of intravenous contrast. Multiplanar reconstructed images and MIPs were obtained and reviewed to evaluate the vascular anatomy. CONTRAST:  OMNIPAQUE IOHEXOL 350 MG/ML SOLN COMPARISON:  Chest CT September 23, 2016; chest radiograph Oct 13, 2018 FINDINGS: CTA CHEST FINDINGS Cardiovascular: There is no intramural hematoma within the thoracic aorta on the noncontrast enhanced study. There is no demonstrable thoracic aortic aneurysm or dissection. The visualized great vessels appear unremarkable. Note that the right innominate and left common carotid arteries arise as a common trunk, an anatomic variant. No pulmonary embolus is appreciable. There is no pericardial effusion or pericardial thickening evident. Mediastinum/Nodes: Thyroid appears unremarkable. There is an enlarged right hilar lymph node measuring 1.4 x 1.4 cm. There are scattered subcentimeter mediastinal lymph nodes and axillary lymph nodes. No other lymph node enlargement is evident. There is a small hiatal hernia. Lungs/Pleura: There is mild bibasilar atelectasis. No pneumothorax. No edema or consolidation. No pleural effusion or pleural thickening evident. Musculoskeletal: There are no blastic or lytic bone lesions. No evident fracture or dislocation. No chest wall lesions evident. Review of the MIP images confirms the above findings. CTA ABDOMEN AND PELVIS FINDINGS VASCULAR Aorta: There is no abdominal aortic aneurysm or dissection. No appreciable atherosclerotic change. Celiac: Celiac artery and its branches appear widely patent. No aneurysm or dissection evident. SMA: Superior mesenteric artery and its branches appear widely patent. No aneurysm or dissection evident. Renals: There are 2 renal arteries on each side, with one of the arteries on each side significantly larger than the other. Renal arteries bilaterally appear widely patent in all segments. No aneurysm or dissection. No  fibromuscular dysplasia evident on either side. IMA: Inferior mesenteric artery in its branches are widely patent. No aneurysm or dissection evident. Inflow: The major pelvic arterial vessels are widely patent throughout their respective courses. No appreciable atherosclerotic change noted. No aneurysm or dissection involving major pelvic arterial vessels. Proximal superficial and profunda femoral arteries are also widely patent without aneurysm or dissection. Veins: No obvious venous abnormality within the limitations of this arterial phase study. Review of the MIP images confirms the above findings. NON-VASCULAR Hepatobiliary: No focal liver lesions are evident. The gallbladder wall is not appreciably thickened. There is no biliary duct dilatation. Pancreas: There is no pancreatic mass or inflammatory focus. Spleen: No splenic lesions are evident. Adrenals/Urinary Tract: Adrenals bilaterally appear normal. Kidneys bilaterally show no evident mass or hydronephrosis on either side. There is no appreciable renal or ureteral calculus. Urinary bladder is midline with wall thickness within normal limits. Stomach/Bowel: There is no appreciable bowel wall or mesenteric thickening. No evident bowel obstruction. Terminal ileum appears unremarkable. No free air or portal venous air evident. Lymphatic: There is no evident adenopathy in the abdomen or pelvis. Reproductive: Prostate and seminal vesicles appear normal in size and contour. No evident pelvic mass. Other: The appendix appears normal. There is no abscess or ascites in the abdomen or pelvis. Musculoskeletal: No evident fracture or dislocation. No blastic or lytic bone lesions. No intramuscular  or abdominal wall lesions evident. Review of the MIP images confirms the above findings. IMPRESSION: CT angiogram chest: 1. No thoracic aortic aneurysm or dissection. No intramural hematoma. 2.  No demonstrable pulmonary embolus. 3.  Areas of mild atelectasis.  No lung edema or  consolidation. 4. Enlarged right hilar lymph node. No other adenopathy evident. Etiology for this single enlarged lymph node is uncertain. 5.  Small hiatal hernia. CT angiogram abdomen; CT angiogram pelvis: 1. No aneurysm or dissection involving the aorta, major pelvic, or major mesenteric vessels. No appreciable obstructive disease. No appreciable atherosclerotic calcification. No fibromuscular dysplasia. 2. No evident bowel wall thickening or bowel obstruction. No abscess in the abdomen pelvis. Appendix appears normal. 3. No renal or ureteral calculus. No hydronephrosis on either side. Urinary bladder wall thickness is within normal limits. Electronically Signed   By: Bretta Bang III M.D.   On: 10/13/2018 19:57        Scheduled Meds: . aspirin EC  81 mg Oral Daily  . atorvastatin  40 mg Oral q1800  . enoxaparin (LOVENOX) injection  40 mg Subcutaneous QHS  . pantoprazole  40 mg Oral Daily   Continuous Infusions:   LOS: 0 days    Time spent:    Zannie Cove, MD [facility]  10/14/2018, 10:36 AM

## 2018-10-14 NOTE — Progress Notes (Signed)
Pt initially called about what was going on and if he was being discharged. Paged Dr. Jomarie Longs and was told patient's ECHO was abnormal and would need further work-up from Cardiology. I updated the patient and explained the results of the ECHO within my scope of practice and that it was a possibility that he might have to stay until Monday for a cardiac cath. Pt became upset and verbally aggressive and wanted to know why no one came back to tell him the results and why he would have to stay till Monday. Re-paged Dr. Jomarie Longs and asked her to call into the room. Patient was able to talk to Dr. Jomarie Longs. While speaking with Dr. Jomarie Longs, patient once again became verbally aggressive while on the phone. I was able to talk to the patient's father and explained what was going on with hopes that the patient's father would be able to calm the patient down so he would not leave AMA. As I was coming back to the front desk, Dr. Rennis Golden came up to the floor to see another patient. I asked Dr. Rennis Golden to speak with patient about the ECHO and the plan of care. Dr. Rennis Golden agreed. Pt was satisfied with the information he received. He is ok with staying till Monday. IV heparin was initiated and pt was educated appropriately. Pt is now resting comfortably. Will continue to monitor.      Tera Helper E

## 2018-10-14 NOTE — Progress Notes (Signed)
See early this am by the cardiology fellow. Troponin trended up to 0.31 - LDL 127. Labs otherwise unremarkable. Plan for echocardiogram today -will follow-up on results. Likely consider coronary CTA. Start atorvastatin 40 mg daily.   Chrystie Nose, MD, [facility], FACP  [place]  [facility]  Medical Director of the [facility]  Card[facility] Diplomate of the American Board of Clinical Lipidology Attending Cardiologist  Direct Dial: [phone]  Fax: [phone]  Website:  [email or web address]

## 2018-10-14 NOTE — Progress Notes (Signed)
  Echocardiogram 2D Echocardiogram has been performed.  Alex Ortiz 10/14/2018, 9:27 AM

## 2018-10-14 NOTE — Progress Notes (Signed)
DAILY PROGRESS NOTE   Patient Name: Alex Ortiz Date of Encounter: 10/14/2018 Cardiologist: No primary care provider on file.  Chief Complaint   No chest pain  Patient Profile   33 yo male with few cardiac risk factors, presented with acute chest pain and elevated troponins.  Subjective   Alex Ortiz had resolution of his chest pain yesterday. He was seen overnight by Dr. Charna Busman in consultation who noted he presented with sharp, SSCP and numbness in both arms, somewhat positional and worse when lying down than sitting up. It was felt this was more consistent with pericarditis. In the ER, he was noted to have CT angiogram of the aorta which was negative for dissection and "no appreciable atherosclerotic calcification or fibromuscular dysplasia" was noted. Troponins were noted to rise to 0.03->0.09->0.31. EKG was compared to prior studies and noted to be unchanged - with J point elevation attributed to LVH with repolarization and borderline ST elevation. Given elevated troponin without ongoing chest pain today we obtained an echocardiogram to evaluate for possible pericarditis or for new wall motion abnormality. I added atorvastatin 40 mg for elevated LDL of 127 which resulted overnight. He had received aspirin 325 mg yesterday and 81 mg daily starting today.   I personally reviewed the echocardiogram (which was formally read by Dr. 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  I discussed the echo findings this afternoon with Alex Ortiz when I became aware of it. The etiology of this NSTEMI is not clear - ?spontaneous dissection or thrombus, plaque rupture less likely given age and lack of risk factors but possible. Doubt COVID-19 angiopathy - he tested negative and had not had recent symptoms.  Objective   Vitals:   10/13/18 2335 10/14/18 0128 10/14/18 0135  10/14/18 1456  BP: 116/82  (!) 134/99 (!) 130/91  Pulse: 63  64 64  Resp: 15   18  Temp:   98.2 F (36.8 C) 98.1 F (36.7 C)  TempSrc:   Oral Oral  SpO2: 100%  99% 98%  Weight:  84.1 kg    Height:  5\' 8"  (1.727 m)      Intake/Output Summary (Last 24 hours) at 10/14/2018 1625 Last data filed at 10/14/2018 1455 Gross per 24 hour  Intake 360 ml  Output --  Net 360 ml   Filed Weights   10/13/18 1652 10/14/18 0128  Weight: 84.8 kg 84.1 kg    Physical Exam   General appearance: alert, no distress and well-developed, in no distress Neck: no carotid bruit, no JVD and thyroid not enlarged, symmetric, no tenderness/mass/nodules Lungs: clear to auscultation bilaterally Heart: regular rate and rhythm Abdomen: soft, non-tender; bowel sounds normal; no masses,  no organomegaly Extremities: extremities normal, atraumatic, no cyanosis or edema Pulses: 2+ and symmetric Skin: Skin color, texture, turgor normal. No rashes or lesions Neurologic: Grossly normal Psych: Upset  Inpatient Medications    Scheduled Meds:  aspirin EC  81 mg Oral Daily   atorvastatin  40 mg Oral q1800   heparin  2,000 Units Intravenous Once   pantoprazole  40 mg Oral Daily    Continuous Infusions:  heparin      PRN Meds: acetaminophen, albuterol, ondansetron (ZOFRAN) IV   Labs   Results for orders placed or performed during the hospital encounter of 10/13/18 (from the past 48 hour(s))  Basic metabolic panel     Status:  Abnormal   Collection Time: 10/13/18  4:57 PM  Result Value Ref Range   Sodium 139 135 - 145 mmol/L   Potassium 3.9 3.5 - 5.1 mmol/L   Chloride 105 98 - 111 mmol/L   CO2 26 22 - 32 mmol/L   Glucose, Bld 122 (H) 70 - 99 mg/dL   BUN 13 6 - 20 mg/dL   Creatinine, Ser 1.61 0.61 - 1.24 mg/dL   Calcium 9.5 8.9 - 09.6 mg/dL   GFR calc non Af Amer >60 >60 mL/min   GFR calc Af Amer >60 >60 mL/min   Anion gap 8 5 - 15    Comment: Performed at [facility], [address]  [place], [place] [address]  CBC     Status: Abnormal   Collection Time: 10/13/18  4:57 PM  Result Value Ref Range   WBC 5.4 4.0 - 10.5 K/uL   RBC 4.94 4.22 - 5.81 MIL/uL   Hemoglobin 14.2 13.0 - 17.0 g/dL   HCT 98.1 19.1 - 47.8 %   MCV 91.9 80.0 - 100.0 fL   MCH 28.7 26.0 - 34.0 pg   MCHC 31.3 30.0 - 36.0 g/dL   RDW 29.5 62.1 - 30.8 %   Platelets 440 (H) 150 - 400 K/uL   nRBC 0.0 0.0 - 0.2 %    Comment: Performed at [facility], [address] [place], [place] [address]  Troponin I - ONCE - STAT     Status: Abnormal   Collection Time: 10/13/18  4:57 PM  Result Value Ref Range   Troponin I 0.03 (HH) <0.03 ng/mL    Comment: CRITICAL RESULT CALLED TO, READ BACK BY AND VERIFIED WITH: T MORRIS,RN 1748 10/13/2018 D BRADLEY Performed at [facility], [address] [place], [place] [address]   Troponin I - Once     Status: Abnormal   Collection Time: 10/13/18  8:07 PM  Result Value Ref Range   Troponin I 0.09 (HH) <0.03 ng/mL    Comment: CRITICAL VALUE NOTED.  VALUE IS CONSISTENT WITH PREVIOUSLY REPORTED AND CALLED VALUE. Performed at [facility], [address], [place], [place] [address]   Urine rapid drug screen (hosp performed)     Status: Abnormal   Collection Time: 10/13/18  8:20 PM  Result Value Ref Range   Opiates NONE DETECTED NONE DETECTED   Cocaine NONE DETECTED NONE DETECTED   Benzodiazepines NONE DETECTED NONE DETECTED   Amphetamines NONE DETECTED NONE DETECTED   Tetrahydrocannabinol POSITIVE (A) NONE DETECTED   Barbiturates NONE DETECTED NONE DETECTED    Comment: (NOTE) DRUG SCREEN FOR MEDICAL PURPOSES ONLY.  IF CONFIRMATION IS NEEDED FOR ANY PURPOSE, NOTIFY LAB WITHIN 5 DAYS. LOWEST DETECTABLE LIMITS FOR URINE DRUG SCREEN Drug Class                     Cutoff (ng/mL) Amphetamine and metabolites    1000 Barbiturate and metabolites    200 Benzodiazepine                 200 Tricyclics and metabolites     300 Opiates and metabolites         300 Cocaine and metabolites        300 THC                            50 Performed at [facility], [address] [place], [place] [address]   SARS Coronavirus 2 (CEPHEID - Performed in [facility] hospital lab), Hosp Order     Status: None   Collection Time: 10/13/18  9:32 PM  Result Value Ref Range   SARS Coronavirus 2 NEGATIVE NEGATIVE    Comment: (NOTE) If result is NEGATIVE SARS-CoV-2 target nucleic acids are NOT DETECTED. The SARS-CoV-2 RNA is generally detectable in upper and lower  respiratory specimens during the acute phase of infection. The lowest  concentration of SARS-CoV-2 viral copies this assay can detect is 250  copies / mL. A negative result does not preclude SARS-CoV-2 infection  and should not be used as the sole basis for treatment or other  patient management decisions.  A negative result may occur with  improper specimen collection / handling, submission of specimen other  than nasopharyngeal swab, presence of viral mutation(s) within the  areas targeted by this assay, and inadequate number of viral copies  (<250 copies / mL). A negative result must be combined with clinical  observations, patient history, and epidemiological information. If result is POSITIVE SARS-CoV-2 target nucleic acids are DETECTED. The SARS-CoV-2 RNA is generally detectable in upper and lower  respiratory specimens dur ing the acute phase of infection.  Positive  results are indicative of active infection with SARS-CoV-2.  Clinical  correlation with patient history and other diagnostic information is  necessary to determine patient infection status.  Positive results do  not rule out bacterial infection or co-infection with other viruses. If result is PRESUMPTIVE POSTIVE SARS-CoV-2 nucleic acids MAY BE PRESENT.   A presumptive positive result was obtained on the submitted specimen  and confirmed on repeat testing.  While 2019 novel coronavirus  (SARS-CoV-2) nucleic acids may  be present in the submitted sample  additional confirmatory testing may be necessary for epidemiological  and / or clinical management purposes  to differentiate between  SARS-CoV-2 and other Sarbecovirus currently known to infect humans.  If clinically indicated additional testing with an alternate test  methodology [phone]) is advised. The SARS-CoV-2 RNA is generally  detectable in upper and lower respiratory sp ecimens during the acute  phase of infection. The expected result is Negative. Fact Sheet for Patients:  BoilerBrush.com.cy Fact Sheet for Healthcare Providers: https://pope.com/ This test is not yet approved or cleared by the [place] FDA and has been authorized for detection and/or diagnosis of SARS-CoV-2 by FDA under an Emergency Use Authorization (EUA).  This EUA will remain in effect (meaning this test can be used) for the duration of the COVID-19 declaration under Section 564(b)(1) of the Act, 21 U.S.C. section 360bbb-3(b)(1), unless the authorization is terminated or revoked sooner. Performed at [facility], [address] [place], [place] [address]   Troponin I - Once-Timed     Status: Abnormal   Collection Time: 10/13/18 11:00 PM  Result Value Ref Range   Troponin I 0.31 (HH) <0.03 ng/mL    Comment: CRITICAL VALUE NOTED.  VALUE IS CONSISTENT WITH PREVIOUSLY REPORTED AND CALLED VALUE. Performed at [facility], [address], [place], [place] [address]   Lipid panel     Status: Abnormal   Collection Time: 10/14/18  2:05 AM  Result Value Ref Range   Cholesterol 186 0 - 200 mg/dL   Triglycerides 70 <725 mg/dL   HDL 45 >36 mg/dL   Total CHOL/HDL Ratio 4.1 RATIO   VLDL 14 0 - 40 mg/dL   LDL Cholesterol 644 (H) 0 - 99 mg/dL  Comment:        Total Cholesterol/HDL:CHD Risk Coronary Heart Disease Risk Table                     Men   Women  1/2 Average Risk   3.4   3.3  Average Risk       5.0    4.4  2 X Average Risk   9.6   7.1  3 X Average Risk  23.4   11.0        Use the calculated Patient Ratio above and the CHD Risk Table to determine the patient's CHD Risk.        ATP III CLASSIFICATION (LDL):  <100     mg/dL   Optimal  161-096  mg/dL   Near or Above                    Optimal  130-159  mg/dL   Borderline  045-409  mg/dL   High  >811     mg/dL   Very High Performed at [facility], [address], [place], [place] [address]   CBC     Status: Abnormal   Collection Time: 10/14/18  2:05 AM  Result Value Ref Range   WBC 9.9 4.0 - 10.5 K/uL   RBC 5.06 4.22 - 5.81 MIL/uL   Hemoglobin 14.8 13.0 - 17.0 g/dL   HCT 29.5 62.1 - 30.8 %   MCV 89.9 80.0 - 100.0 fL   MCH 29.2 26.0 - 34.0 pg   MCHC 32.5 30.0 - 36.0 g/dL   RDW 65.7 84.6 - 96.2 %   Platelets 412 (H) 150 - 400 K/uL   nRBC 0.0 0.0 - 0.2 %    Comment: Performed at [facility], [address] [place], [place] [address]  Basic metabolic panel     Status: Abnormal   Collection Time: 10/14/18  2:05 AM  Result Value Ref Range   Sodium 136 135 - 145 mmol/L   Potassium 3.9 3.5 - 5.1 mmol/L   Chloride 101 98 - 111 mmol/L   CO2 23 22 - 32 mmol/L   Glucose, Bld 108 (H) 70 - 99 mg/dL   BUN 8 6 - 20 mg/dL   Creatinine, Ser 1.32 0.61 - 1.24 mg/dL   Calcium 9.5 8.9 - 44.0 mg/dL   GFR calc non Af Amer >60 >60 mL/min   GFR calc Af Amer >60 >60 mL/min   Anion gap 12 5 - 15    Comment: Performed at [facility], [address], [place], [place] [address]    ECG   Today's EKG is pending (I have asked it to be performed and will review)  Telemetry   Sinus rhythm at 74, Q waves and 1 mm ST elevation in I, AVF (high lateral leads) - Personally Reviewed  Radiology    Dg Chest 2 View  Result Date: 10/13/2018 CLINICAL DATA:  Chest pain. EXAM: CHEST - 2 VIEW COMPARISON:  Chest x-ray and CT chest dated September 23, 2016. FINDINGS: The heart size and mediastinal contours are within normal limits. Both lungs  are clear. The visualized skeletal structures are unremarkable. IMPRESSION: No active cardiopulmonary disease. Electronically Signed   By: Obie Dredge M.D.   On: 10/13/2018 19:19   Ct Angio Chest/abd/pel For Dissection W And/or Wo Contrast  Result Date: 10/13/2018 CLINICAL DATA:  Chest and abdomen pain EXAM: CT ANGIOGRAPHY CHEST, ABDOMEN AND PELVIS TECHNIQUE:  Initially, axial CT images were obtained through the chest without intravenous contrast material administration. Multidetector CT imaging through the chest, abdomen and pelvis was performed using the standard protocol during bolus administration of intravenous contrast. Multiplanar reconstructed images and MIPs were obtained and reviewed to evaluate the vascular anatomy. CONTRAST:  100mL OMNIPAQUE IOHEXOL 350 MG/ML SOLN COMPARISON:  Chest CT September 23, 2016; chest radiograph Oct 13, 2018 FINDINGS: CTA CHEST FINDINGS Cardiovascular: There is no intramural hematoma within the thoracic aorta on the noncontrast enhanced study. There is no demonstrable thoracic aortic aneurysm or dissection. The visualized great vessels appear unremarkable. Note that the right innominate and left common carotid arteries arise as a common trunk, an anatomic variant. No pulmonary embolus is appreciable. There is no pericardial effusion or pericardial thickening evident. Mediastinum/Nodes: Thyroid appears unremarkable. There is an enlarged right hilar lymph node measuring 1.4 x 1.4 cm. There are scattered subcentimeter mediastinal lymph nodes and axillary lymph nodes. No other lymph node enlargement is evident. There is a small hiatal hernia. Lungs/Pleura: There is mild bibasilar atelectasis. No pneumothorax. No edema or consolidation. No pleural effusion or pleural thickening evident. Musculoskeletal: There are no blastic or lytic bone lesions. No evident fracture or dislocation. No chest wall lesions evident. Review of the MIP images confirms the above findings. CTA ABDOMEN  AND PELVIS FINDINGS VASCULAR Aorta: There is no abdominal aortic aneurysm or dissection. No appreciable atherosclerotic change. Celiac: Celiac artery and its branches appear widely patent. No aneurysm or dissection evident. SMA: Superior mesenteric artery and its branches appear widely patent. No aneurysm or dissection evident. Renals: There are 2 renal arteries on each side, with one of the arteries on each side significantly larger than the other. Renal arteries bilaterally appear widely patent in all segments. No aneurysm or dissection. No fibromuscular dysplasia evident on either side. IMA: Inferior mesenteric artery in its branches are widely patent. No aneurysm or dissection evident. Inflow: The major pelvic arterial vessels are widely patent throughout their respective courses. No appreciable atherosclerotic change noted. No aneurysm or dissection involving major pelvic arterial vessels. Proximal superficial and profunda femoral arteries are also widely patent without aneurysm or dissection. Veins: No obvious venous abnormality within the limitations of this arterial phase study. Review of the MIP images confirms the above findings. NON-VASCULAR Hepatobiliary: No focal liver lesions are evident. The gallbladder wall is not appreciably thickened. There is no biliary duct dilatation. Pancreas: There is no pancreatic mass or inflammatory focus. Spleen: No splenic lesions are evident. Adrenals/Urinary Tract: Adrenals bilaterally appear normal. Kidneys bilaterally show no evident mass or hydronephrosis on either side. There is no appreciable renal or ureteral calculus. Urinary bladder is midline with wall thickness within normal limits. Stomach/Bowel: There is no appreciable bowel wall or mesenteric thickening. No evident bowel obstruction. Terminal ileum appears unremarkable. No free air or portal venous air evident. Lymphatic: There is no evident adenopathy in the abdomen or pelvis. Reproductive: Prostate and  seminal vesicles appear normal in size and contour. No evident pelvic mass. Other: The appendix appears normal. There is no abscess or ascites in the abdomen or pelvis. Musculoskeletal: No evident fracture or dislocation. No blastic or lytic bone lesions. No intramuscular or abdominal wall lesions evident. Review of the MIP images confirms the above findings. IMPRESSION: CT angiogram chest: 1. No thoracic aortic aneurysm or dissection. No intramural hematoma. 2.  No demonstrable pulmonary embolus. 3.  Areas of mild atelectasis.  No lung edema or consolidation. 4. Enlarged right hilar lymph node. No other  adenopathy evident. Etiology for this single enlarged lymph node is uncertain. 5.  Small hiatal hernia. CT angiogram abdomen; CT angiogram pelvis: 1. No aneurysm or dissection involving the aorta, major pelvic, or major mesenteric vessels. No appreciable obstructive disease. No appreciable atherosclerotic calcification. No fibromuscular dysplasia. 2. No evident bowel wall thickening or bowel obstruction. No abscess in the abdomen pelvis. Appendix appears normal. 3. No renal or ureteral calculus. No hydronephrosis on either side. Urinary bladder wall thickness is within normal limits. Electronically Signed   By: Bretta Bang III M.D.   On: 10/13/2018 19:57    Cardiac Studies   Procedure: 2D Echo  Indications:    Chest pain 786.50   History:        Patient has no prior history of Echocardiogram examinations.                 Signs/Symptoms: Chest Pain and elevated troponin.   Sonographer:    Delcie Roch Referring Phys: [id number] VISHAL R PATEL  IMPRESSIONS    1. The left ventricle has mild-moderately reduced systolic function, with an ejection fraction of 40-45%. The cavity size was normal. There is akinesis of the apical septal, inferior, anterior, lateral, inferolateral and apical segments. Left  ventricular diastolic parameters were normal.  2. The right ventricle has normal systolic  function. The cavity was normal. There is no increase in right ventricular wall thickness.  3. The aortic valve is grossly normal.  Assessment   Principal Problem:   Non-ST elevation (NSTEMI) myocardial infarction [facility]) Active Problems:   Asthma   Hyperlipidemia   Plan   1. Mr. Geraci appears to have had NSTEMI with elevated troponin and new distal anterior, apical and inferoapical wall motion abnormality with LVEF 40-45%. He is appropriately on aspirin and was started on high potency atorvastatin. Will start IV heparin and low dose metoprolol 12.5 mg BID tonight. The etiology of this is unclear, but could be plaque rupture, thrombus or spontaneous dissection. Discussed the recommendation for left heart catheterization and possible coronary intervention. He is agreeable to this - likely Monday unless he develops any more chest pain. Would repeat EKG today to look for acute changes - it appears the Q waves were noted on EKG last evening - however, in comparison to the EKG on 09/13/2016, the J point elevation and small Q waves were noted at that time and the EKG's look similar.  Time Spent Directly with Patient:  I have spent a total of 35 minutes with the patient reviewing hospital notes, telemetry, EKGs, labs and examining the patient as well as establishing an assessment and plan that was discussed personally with the patient.  > 50% of time was spent in direct patient care.  Length of Stay:  LOS: 0 days   Chrystie Nose, MD, [facility], FACP  [place]   [facility]  Medical Director of the Advanced Lipid Disorders &  Cardiovascular Risk Reduction Clinic Diplomate of the American Board of Clinical Lipidology Attending Cardiologist  Direct Dial: [phone]   Fax: [phone]  Website:  [email or web address] Nicely Zuha Dejonge 10/14/2018, 4:25 PM

## 2018-10-14 NOTE — ED Notes (Signed)
ED TO INPATIENT HANDOFF REPORT  ED Nurse Name and Phone #: Morrie Sheldon [phone]  S Name/Age/Gender Alex Ortiz 33 y.o. male Room/Bed: 030C/030C  Code Status   Code Status: Not on file  Home/SNF/Other Home Patient oriented to: self, place, time and situation Is this baseline? Yes   Triage Complete: Triage complete  Chief Complaint Acid Reflux   Triage Note Pt to ED with c/o mid lower chest pain onset last pm after eating spicy food at a cook out.  Pt also st/s he may have pulled something lifting weights.     Allergies No Known Allergies  Level of Care/Admitting Diagnosis ED Disposition    ED Disposition Condition Comment   Admit  Hospital Area: [facility] [[id number]]  Level of Care: Telemetry Cardiac [103]  I expect the patient will be discharged within 24 hours: No (not a candidate for 5C-Observation unit)  Covid Evaluation: N/A  Diagnosis: Chest pain [415830]  Admitting Physician: Charlsie Quest [[id number]]  Attending Physician: Charlsie Quest [[id number]]  PT Class (Do Not Modify): Observation [104]  PT Acc Code (Do Not Modify): Observation [10022]       B Medical/Surgery History Past Medical History:  Diagnosis Date  . Asthma    Past Surgical History:  Procedure Laterality Date  . HERNIA REPAIR       A IV Location/Drains/Wounds Patient Lines/Drains/Airways Status   Active Line/Drains/Airways    Name:   Placement date:   Placement time:   Site:   Days:   Peripheral IV 10/13/18 Left Antecubital   10/13/18    1746    Antecubital   1          Intake/Output Last 24 hours No intake or output data in the 24 hours ending 10/14/18 0038  Labs/Imaging Results for orders placed or performed during the hospital encounter of 10/13/18 (from the past 48 hour(s))  Basic metabolic panel     Status: Abnormal   Collection Time: 10/13/18  4:57 PM  Result Value Ref Range   Sodium 139 135 - 145 mmol/L   Potassium 3.9 3.5 - 5.1 mmol/L   Chloride 105 98 -  111 mmol/L   CO2 26 22 - 32 mmol/L   Glucose, Bld 122 (H) 70 - 99 mg/dL   BUN 13 6 - 20 mg/dL   Creatinine, Ser 5.94 0.61 - 1.24 mg/dL   Calcium 9.5 8.9 - 58.5 mg/dL   GFR calc non Af Amer >60 >60 mL/min   GFR calc Af Amer >60 >60 mL/min   Anion gap 8 5 - 15    Comment: Performed at [facility], [address], [place], [place] [address]  CBC     Status: Abnormal   Collection Time: 10/13/18  4:57 PM  Result Value Ref Range   WBC 5.4 4.0 - 10.5 K/uL   RBC 4.94 4.22 - 5.81 MIL/uL   Hemoglobin 14.2 13.0 - 17.0 g/dL   HCT 46.2 86.3 - 81.7 %   MCV 91.9 80.0 - 100.0 fL   MCH 28.7 26.0 - 34.0 pg   MCHC 31.3 30.0 - 36.0 g/dL   RDW 71.1 65.7 - 90.3 %   Platelets 440 (H) 150 - 400 K/uL   nRBC 0.0 0.0 - 0.2 %    Comment: Performed at [facility], [address] [place], [place] [address]  Troponin I - ONCE - STAT     Status: Abnormal   Collection Time: 10/13/18  4:57 PM  Result Value Ref Range   Troponin I 0.03 (HH) <0.03 ng/mL    Comment: CRITICAL RESULT CALLED TO, READ BACK BY AND VERIFIED WITH: T MORRIS,RN 1748 10/13/2018 D BRADLEY Performed at [facility], [address] [place], [place] [address]   Troponin I - Once     Status: Abnormal   Collection Time: 10/13/18  8:07 PM  Result Value Ref Range   Troponin I 0.09 (HH) <0.03 ng/mL    Comment: CRITICAL VALUE NOTED.  VALUE IS CONSISTENT WITH PREVIOUSLY REPORTED AND CALLED VALUE. Performed at [facility], [address] [place], [place] [address]   Urine rapid drug screen (hosp performed)     Status: Abnormal   Collection Time: 10/13/18  8:20 PM  Result Value Ref Range   Opiates NONE DETECTED NONE DETECTED   Cocaine NONE DETECTED NONE DETECTED   Benzodiazepines NONE DETECTED NONE DETECTED   Amphetamines NONE DETECTED NONE DETECTED   Tetrahydrocannabinol POSITIVE (A) NONE DETECTED   Barbiturates NONE DETECTED NONE DETECTED    Comment: (NOTE) DRUG SCREEN FOR MEDICAL PURPOSES ONLY.  IF CONFIRMATION  IS NEEDED FOR ANY PURPOSE, NOTIFY LAB WITHIN 5 DAYS. LOWEST DETECTABLE LIMITS FOR URINE DRUG SCREEN Drug Class                     Cutoff (ng/mL) Amphetamine and metabolites    1000 Barbiturate and metabolites    200 Benzodiazepine                 200 Tricyclics and metabolites     300 Opiates and metabolites        300 Cocaine and metabolites        300 THC                            50 Performed at [facility], [address] [place], [place] [address]   SARS Coronavirus 2 (CEPHEID - Performed in [facility] hospital lab), Hosp Order     Status: None   Collection Time: 10/13/18  9:32 PM  Result Value Ref Range   SARS Coronavirus 2 NEGATIVE NEGATIVE    Comment: (NOTE) If result is NEGATIVE SARS-CoV-2 target nucleic acids are NOT DETECTED. The SARS-CoV-2 RNA is generally detectable in upper and lower  respiratory specimens during the acute phase of infection. The lowest  concentration of SARS-CoV-2 viral copies this assay can detect is 250  copies / mL. A negative result does not preclude SARS-CoV-2 infection  and should not be used as the sole basis for treatment or other  patient management decisions.  A negative result may occur with  improper specimen collection / handling, submission of specimen other  than nasopharyngeal swab, presence of viral mutation(s) within the  areas targeted by this assay, and inadequate number of viral copies  (<250 copies / mL). A negative result must be combined with clinical  observations, patient history, and epidemiological information. If result is POSITIVE SARS-CoV-2 target nucleic acids are DETECTED. The SARS-CoV-2 RNA is generally detectable in upper and lower  respiratory specimens dur ing the acute phase of infection.  Positive  results are indicative of active infection with SARS-CoV-2.  Clinical  correlation with patient history and other diagnostic information is  necessary to determine patient infection status.  Positive  results do  not rule out bacterial infection or co-infection with other viruses. If result is PRESUMPTIVE POSTIVE SARS-CoV-2 nucleic acids  MAY BE PRESENT.   A presumptive positive result was obtained on the submitted specimen  and confirmed on repeat testing.  While 2019 novel coronavirus  (SARS-CoV-2) nucleic acids may be present in the submitted sample  additional confirmatory testing may be necessary for epidemiological  and / or clinical management purposes  to differentiate between  SARS-CoV-2 and other Sarbecovirus currently known to infect humans.  If clinically indicated additional testing with an alternate test  methodology [phone]) is advised. The SARS-CoV-2 RNA is generally  detectable in upper and lower respiratory sp ecimens during the acute  phase of infection. The expected result is Negative. Fact Sheet for Patients:  BoilerBrush.com.cy Fact Sheet for Healthcare Providers: https://pope.com/ This test is not yet approved or cleared by the [place] FDA and has been authorized for detection and/or diagnosis of SARS-CoV-2 by FDA under an Emergency Use Authorization (EUA).  This EUA will remain in effect (meaning this test can be used) for the duration of the COVID-19 declaration under Section 564(b)(1) of the Act, 21 U.S.C. section 360bbb-3(b)(1), unless the authorization is terminated or revoked sooner. Performed at [facility], [address] [place], [place] [address]   Troponin I - Once-Timed     Status: Abnormal   Collection Time: 10/13/18 11:00 PM  Result Value Ref Range   Troponin I 0.31 (HH) <0.03 ng/mL    Comment: CRITICAL VALUE NOTED.  VALUE IS CONSISTENT WITH PREVIOUSLY REPORTED AND CALLED VALUE. Performed at [facility], [address] [place], [place] [address]    Dg Chest 2 View  Result Date: 10/13/2018 CLINICAL DATA:  Chest pain. EXAM: CHEST - 2 VIEW COMPARISON:  Chest x-ray and CT  chest dated September 23, 2016. FINDINGS: The heart size and mediastinal contours are within normal limits. Both lungs are clear. The visualized skeletal structures are unremarkable. IMPRESSION: No active cardiopulmonary disease. Electronically Signed   By: Obie Dredge M.D.   On: 10/13/2018 19:19   Ct Angio Chest/abd/pel For Dissection W And/or Wo Contrast  Result Date: 10/13/2018 CLINICAL DATA:  Chest and abdomen pain EXAM: CT ANGIOGRAPHY CHEST, ABDOMEN AND PELVIS TECHNIQUE: Initially, axial CT images were obtained through the chest without intravenous contrast material administration. Multidetector CT imaging through the chest, abdomen and pelvis was performed using the standard protocol during bolus administration of intravenous contrast. Multiplanar reconstructed images and MIPs were obtained and reviewed to evaluate the vascular anatomy. CONTRAST:  OMNIPAQUE IOHEXOL 350 MG/ML SOLN COMPARISON:  Chest CT September 23, 2016; chest radiograph Oct 13, 2018 FINDINGS: CTA CHEST FINDINGS Cardiovascular: There is no intramural hematoma within the thoracic aorta on the noncontrast enhanced study. There is no demonstrable thoracic aortic aneurysm or dissection. The visualized great vessels appear unremarkable. Note that the right innominate and left common carotid arteries arise as a common trunk, an anatomic variant. No pulmonary embolus is appreciable. There is no pericardial effusion or pericardial thickening evident. Mediastinum/Nodes: Thyroid appears unremarkable. There is an enlarged right hilar lymph node measuring 1.4 x 1.4 cm. There are scattered subcentimeter mediastinal lymph nodes and axillary lymph nodes. No other lymph node enlargement is evident. There is a small hiatal hernia. Lungs/Pleura: There is mild bibasilar atelectasis. No pneumothorax. No edema or consolidation. No pleural effusion or pleural thickening evident. Musculoskeletal: There are no blastic or lytic bone lesions. No evident fracture  or dislocation. No chest wall lesions evident. Review of the MIP images confirms the above findings. CTA ABDOMEN AND PELVIS FINDINGS VASCULAR Aorta: There is  no abdominal aortic aneurysm or dissection. No appreciable atherosclerotic change. Celiac: Celiac artery and its branches appear widely patent. No aneurysm or dissection evident. SMA: Superior mesenteric artery and its branches appear widely patent. No aneurysm or dissection evident. Renals: There are 2 renal arteries on each side, with one of the arteries on each side significantly larger than the other. Renal arteries bilaterally appear widely patent in all segments. No aneurysm or dissection. No fibromuscular dysplasia evident on either side. IMA: Inferior mesenteric artery in its branches are widely patent. No aneurysm or dissection evident. Inflow: The major pelvic arterial vessels are widely patent throughout their respective courses. No appreciable atherosclerotic change noted. No aneurysm or dissection involving major pelvic arterial vessels. Proximal superficial and profunda femoral arteries are also widely patent without aneurysm or dissection. Veins: No obvious venous abnormality within the limitations of this arterial phase study. Review of the MIP images confirms the above findings. NON-VASCULAR Hepatobiliary: No focal liver lesions are evident. The gallbladder wall is not appreciably thickened. There is no biliary duct dilatation. Pancreas: There is no pancreatic mass or inflammatory focus. Spleen: No splenic lesions are evident. Adrenals/Urinary Tract: Adrenals bilaterally appear normal. Kidneys bilaterally show no evident mass or hydronephrosis on either side. There is no appreciable renal or ureteral calculus. Urinary bladder is midline with wall thickness within normal limits. Stomach/Bowel: There is no appreciable bowel wall or mesenteric thickening. No evident bowel obstruction. Terminal ileum appears unremarkable. No free air or portal  venous air evident. Lymphatic: There is no evident adenopathy in the abdomen or pelvis. Reproductive: Prostate and seminal vesicles appear normal in size and contour. No evident pelvic mass. Other: The appendix appears normal. There is no abscess or ascites in the abdomen or pelvis. Musculoskeletal: No evident fracture or dislocation. No blastic or lytic bone lesions. No intramuscular or abdominal wall lesions evident. Review of the MIP images confirms the above findings. IMPRESSION: CT angiogram chest: 1. No thoracic aortic aneurysm or dissection. No intramural hematoma. 2.  No demonstrable pulmonary embolus. 3.  Areas of mild atelectasis.  No lung edema or consolidation. 4. Enlarged right hilar lymph node. No other adenopathy evident. Etiology for this single enlarged lymph node is uncertain. 5.  Small hiatal hernia. CT angiogram abdomen; CT angiogram pelvis: 1. No aneurysm or dissection involving the aorta, major pelvic, or major mesenteric vessels. No appreciable obstructive disease. No appreciable atherosclerotic calcification. No fibromuscular dysplasia. 2. No evident bowel wall thickening or bowel obstruction. No abscess in the abdomen pelvis. Appendix appears normal. 3. No renal or ureteral calculus. No hydronephrosis on either side. Urinary bladder wall thickness is within normal limits. Electronically Signed   By: Bretta Bang III M.D.   On: 10/13/2018 19:57    Pending Labs Unresulted Labs (From admission, onward)   None      Vitals/Pain Today's Vitals   10/13/18 2000 10/13/18 2135 10/13/18 2145 10/13/18 2230  BP: 126/83  131/89 (!) 136/98  Pulse: 65  71 68  Resp: Temp:      TempSrc:      SpO2: 95%  98% 99%  Weight:      Height:      PainSc:  5       Isolation Precautions No active isolations  Medications Medications  sodium chloride flush (NS) 0.9 % injection 3 mL (3 mLs Intravenous Given 10/13/18 1747)  morphine 4 MG/ML injection 4 mg (4 mg Intravenous Given  10/13/18 1802)  aspirin tablet 325 mg (325  mg Oral Given 10/13/18 1804)  alum & mag hydroxide-simeth (MAALOX/MYLANTA) 200-200-20 MG/5ML suspension 30 mL (30 mLs Oral Given 10/13/18 1805)    And  lidocaine (XYLOCAINE) 2 % viscous mouth solution 15 mL (15 mLs Oral Given 10/13/18 1805)  famotidine (PEPCID) tablet 20 mg (20 mg Oral Given 10/13/18 1804)  fentaNYL (SUBLIMAZE) injection 50 mcg (50 mcg Intravenous Given 10/13/18 1947)  iohexol (OMNIPAQUE) 350 MG/ML injection 100 mL (100 mLs Intravenous Contrast Given 10/13/18 1941)  ketorolac (TORADOL) 30 MG/ML injection 30 mg (30 mg Intravenous Given 10/13/18 2333)    Mobility walks Low fall risk   Focused Assessments Cardiac Assessment Handoff:  Cardiac Rhythm: Normal sinus rhythm Lab Results  Component Value Date   TROPONINI 0.31 (HH) 10/13/2018   No results found for: DDIMER Does the Patient currently have chest pain? Yes     R Recommendations: See Admitting Provider Note  Report given to:   Additional Notes:

## 2018-10-14 NOTE — H&P (Signed)
History and Physical    Alex Ortiz AOZ:[id number] DOB: March 14, 1986 DOA: 10/13/2018  PCP: Patient, No Pcp Per  Patient coming from: Home  I have personally briefly reviewed patient's old medical records in [facility]  Chief Complaint: Chest pain  HPI: Alex Ortiz is a 33 y.o. male with medical history significant for asthma who presents the ED for evaluation of chest pain.  Patient states he was in his usual state of health the morning of 10/13/2018.  He did some light workout in the morning involving push-ups and light free weights.  Several hours later he walked to the store and developed a sudden sharp central chest pain which initially thought was related to dehydration due to being out in the heat.  He bought a bottle of water and had some initial relief after drinking it.    He went to walk home and after a while developed a central squeezing type chest pain associated with numbness down both of his arms.  When he reached his home he sat down had some temporary relief.  He smoked some marijuana and after a while had a return of his central chest pain.  He called an Benedetto Goad and was brought to the ED for further evaluation.  He says while in triage he developed more severe central chest pain described as a burning sensation.  He also developed diaphoresis.  He felt as if he was having severe heartburn/reflux.  Pain has not been related to meals or positional change.  He denies any associated palpitations, cough, dyspnea, wheezing, abdominal pain, dysuria, diarrhea, nausea, vomiting.  Other than marijuana, he denies any illicit drug use including cocaine or IV drugs.  He smokes 2-3 cigarettes/day and reports occasional alcohol use with last drink 3 days ago.  He says his father had cardiac bypass in his 69s.  He denies any other personal chronic medical conditions other than asthma which is well controlled.  ED Course:  Initial vitals showed BP 120/74, pulse 76, RR 16, temp 99.1  Fahrenheit, SPO2 98% on room air.  Labs are notable for WBC 5.4, hemoglobin 14.2, platelets 440,000, serum glucose 122, CBC and metabolic panel were otherwise unremarkable.  SARS-CoV-2 test was negative.  UDS was positive for THC, negative for opiates and cocaine. Troponin trend was 0.03 >> 0.09 >> 0.31 over 3 hour intervals.  Initial EKG normal sinus rhythm with T wave flattening V3-V6.  Repeat EKG shows normal sinus rhythm with Q wave in aVL.  2 view chest x-ray showed clear lung fields without acute cardiopulmonary process.  CTA Chest/abdomen/pelvis dissection study was negative for aortic, pelvic, or major mesenteric vessel aneurysm or dissection.  No evidence of PE was seen.  Enlarged right hilar lymph node was seen without other evident adenopathy.  Cardiology were consulted and recommended medical admission for chest pain rule out.  The hospitalist service was consulted to admit for further evaluation and management.  Review of Systems: 10 point review of systems is negative except for pertinent positives and negatives as documented in history of present illness above.   Past Medical History:  Diagnosis Date   Asthma     Past Surgical History:  Procedure Laterality Date   HERNIA REPAIR     Social History:  reports that he has been smoking cigarettes. He has never used smokeless tobacco. He reports current alcohol use. He reports current drug use. Drug: Marijuana.  No Known Allergies  Family History  Problem Relation Age of Onset   Hypertension Mother  Heart disease Father        Cardiac bypass in his 69s.     Prior to Admission medications   Not on File    Physical Exam: Vitals:   10/13/18 2000 10/13/18 2145 10/13/18 2230 10/13/18 2335  BP: 126/83 131/89 (!) 136/98 116/82  Pulse: 65 71 68 63  Resp: 20 13 13 15   Temp:      TempSrc:      SpO2: 95% 98% 99% 100%  Weight:      Height:        Constitutional: Young man resting supine in bed, NAD, calm,  comfortable Eyes: PERRL, lids and conjunctivae normal ENMT: Mucous membranes are moist. Posterior pharynx clear of any exudate or lesions.Normal dentition.  Neck: normal, supple, no masses. Respiratory: clear to auscultation bilaterally, no wheezing, no crackles. Normal respiratory effort. No accessory muscle use.  Cardiovascular: Regular rate and rhythm, no murmurs / rubs / gallops. No extremity edema. 2+ radial and pedal pulses. Abdomen: no tenderness, no masses palpated. No hepatosplenomegaly. Bowel sounds positive.  Musculoskeletal: no clubbing / cyanosis. No joint deformity upper and lower extremities. Good ROM, no contractures. Normal muscle tone.  No reproducible chest wall tenderness. Skin: no rashes, lesions, ulcers. No induration Neurologic: CN 2-12 grossly intact. Sensation intact, Strength 5/5 in all 4.  Psychiatric: Normal judgment and insight. Alert and oriented x 3. Normal mood.    Labs on Admission: I have personally reviewed following labs and imaging studies  CBC: Recent Labs  Lab 10/13/18 1657  WBC 5.4  HGB 14.2  HCT 45.4  MCV 91.9  PLT 440*   Basic Metabolic Panel: Recent Labs  Lab 10/13/18 1657  NA 139  K 3.9  CL 105  CO2 26  GLUCOSE 122*  BUN 13  CREATININE 1.13  CALCIUM 9.5   GFR: Estimated Creatinine Clearance: 100.5 mL/min (by C-G formula based on SCr of 1.13 mg/dL). Liver Function Tests: No results for input(s): AST, ALT, ALKPHOS, BILITOT, PROT, ALBUMIN in the last 168 hours. No results for input(s): LIPASE, AMYLASE in the last 168 hours. No results for input(s): AMMONIA in the last 168 hours. Coagulation Profile: No results for input(s): INR, PROTIME in the last 168 hours. Cardiac Enzymes: Recent Labs  Lab 10/13/18 1657 10/13/18 2007 10/13/18 2300  TROPONINI 0.03* 0.09* 0.31*   BNP (last 3 results) No results for input(s): PROBNP in the last 8760 hours. HbA1C: No results for input(s): HGBA1C in the last 72 hours. CBG: No results  for input(s): GLUCAP in the last 168 hours. Lipid Profile: No results for input(s): CHOL, HDL, LDLCALC, TRIG, CHOLHDL, LDLDIRECT in the last 72 hours. Thyroid Function Tests: No results for input(s): TSH, T4TOTAL, FREET4, T3FREE, THYROIDAB in the last 72 hours. Anemia Panel: No results for input(s): VITAMINB12, FOLATE, FERRITIN, TIBC, IRON, RETICCTPCT in the last 72 hours. Urine analysis: No results found for: COLORURINE, APPEARANCEUR, LABSPEC, PHURINE, GLUCOSEU, HGBUR, BILIRUBINUR, KETONESUR, PROTEINUR, UROBILINOGEN, NITRITE, LEUKOCYTESUR  Radiological Exams on Admission: Dg Chest 2 View  Result Date: 10/13/2018 CLINICAL DATA:  Chest pain. EXAM: CHEST - 2 VIEW COMPARISON:  Chest x-ray and CT chest dated September 23, 2016. FINDINGS: The heart size and mediastinal contours are within normal limits. Both lungs are clear. The visualized skeletal structures are unremarkable. IMPRESSION: No active cardiopulmonary disease. Electronically Signed   By: Obie Dredge M.D.   On: 10/13/2018 19:19   Ct Angio Chest/abd/pel For Dissection W And/or Wo Contrast  Result Date: 10/13/2018 CLINICAL DATA:  Chest and abdomen  pain EXAM: CT ANGIOGRAPHY CHEST, ABDOMEN AND PELVIS TECHNIQUE: Initially, axial CT images were obtained through the chest without intravenous contrast material administration. Multidetector CT imaging through the chest, abdomen and pelvis was performed using the standard protocol during bolus administration of intravenous contrast. Multiplanar reconstructed images and MIPs were obtained and reviewed to evaluate the vascular anatomy. CONTRAST:  OMNIPAQUE IOHEXOL 350 MG/ML SOLN COMPARISON:  Chest CT September 23, 2016; chest radiograph Oct 13, 2018 FINDINGS: CTA CHEST FINDINGS Cardiovascular: There is no intramural hematoma within the thoracic aorta on the noncontrast enhanced study. There is no demonstrable thoracic aortic aneurysm or dissection. The visualized great vessels appear unremarkable. Note  that the right innominate and left common carotid arteries arise as a common trunk, an anatomic variant. No pulmonary embolus is appreciable. There is no pericardial effusion or pericardial thickening evident. Mediastinum/Nodes: Thyroid appears unremarkable. There is an enlarged right hilar lymph node measuring 1.4 x 1.4 cm. There are scattered subcentimeter mediastinal lymph nodes and axillary lymph nodes. No other lymph node enlargement is evident. There is a small hiatal hernia. Lungs/Pleura: There is mild bibasilar atelectasis. No pneumothorax. No edema or consolidation. No pleural effusion or pleural thickening evident. Musculoskeletal: There are no blastic or lytic bone lesions. No evident fracture or dislocation. No chest wall lesions evident. Review of the MIP images confirms the above findings. CTA ABDOMEN AND PELVIS FINDINGS VASCULAR Aorta: There is no abdominal aortic aneurysm or dissection. No appreciable atherosclerotic change. Celiac: Celiac artery and its branches appear widely patent. No aneurysm or dissection evident. SMA: Superior mesenteric artery and its branches appear widely patent. No aneurysm or dissection evident. Renals: There are 2 renal arteries on each side, with one of the arteries on each side significantly larger than the other. Renal arteries bilaterally appear widely patent in all segments. No aneurysm or dissection. No fibromuscular dysplasia evident on either side. IMA: Inferior mesenteric artery in its branches are widely patent. No aneurysm or dissection evident. Inflow: The major pelvic arterial vessels are widely patent throughout their respective courses. No appreciable atherosclerotic change noted. No aneurysm or dissection involving major pelvic arterial vessels. Proximal superficial and profunda femoral arteries are also widely patent without aneurysm or dissection. Veins: No obvious venous abnormality within the limitations of this arterial phase study. Review of the MIP  images confirms the above findings. NON-VASCULAR Hepatobiliary: No focal liver lesions are evident. The gallbladder wall is not appreciably thickened. There is no biliary duct dilatation. Pancreas: There is no pancreatic mass or inflammatory focus. Spleen: No splenic lesions are evident. Adrenals/Urinary Tract: Adrenals bilaterally appear normal. Kidneys bilaterally show no evident mass or hydronephrosis on either side. There is no appreciable renal or ureteral calculus. Urinary bladder is midline with wall thickness within normal limits. Stomach/Bowel: There is no appreciable bowel wall or mesenteric thickening. No evident bowel obstruction. Terminal ileum appears unremarkable. No free air or portal venous air evident. Lymphatic: There is no evident adenopathy in the abdomen or pelvis. Reproductive: Prostate and seminal vesicles appear normal in size and contour. No evident pelvic mass. Other: The appendix appears normal. There is no abscess or ascites in the abdomen or pelvis. Musculoskeletal: No evident fracture or dislocation. No blastic or lytic bone lesions. No intramuscular or abdominal wall lesions evident. Review of the MIP images confirms the above findings. IMPRESSION: CT angiogram chest: 1. No thoracic aortic aneurysm or dissection. No intramural hematoma. 2.  No demonstrable pulmonary embolus. 3.  Areas of mild atelectasis.  No lung edema or  consolidation. 4. Enlarged right hilar lymph node. No other adenopathy evident. Etiology for this single enlarged lymph node is uncertain. 5.  Small hiatal hernia. CT angiogram abdomen; CT angiogram pelvis: 1. No aneurysm or dissection involving the aorta, major pelvic, or major mesenteric vessels. No appreciable obstructive disease. No appreciable atherosclerotic calcification. No fibromuscular dysplasia. 2. No evident bowel wall thickening or bowel obstruction. No abscess in the abdomen pelvis. Appendix appears normal. 3. No renal or ureteral calculus. No  hydronephrosis on either side. Urinary bladder wall thickness is within normal limits. Electronically Signed   By: Bretta BangWilliam  Woodruff III M.D.   On: 10/13/2018 19:57    EKG: Independently reviewed. Initial EKG normal sinus rhythm with T wave flattening V3-V6.  Repeat EKG shows normal sinus rhythm with Q wave in aVL.  Assessment/Plan Principal Problem:   Chest pain Active Problems:   Asthma   Elevated troponin  Alex Ortiz is a 33 y.o. male with medical history significant for asthma who is admitted for chest pain rule out.  Atypical chest pain with elevated troponin: Troponin trending up to 0.31.  Symptoms are atypical, suspicious for upper GI etiology such as gastritis.  May also be a component of anxiety. CTA chest/abd/pelvis reassuring. -Place in observation on telemetry, repeat EKG in a.m. -Cycle cardiac enzymes -Obtain echocardiogram -Start oral Protonix -Continue aspirin 81 mg daily for now, hold other NSAIDs  Asthma: Stable, no active wheezing or dyspnea. -Albuterol as needed  DVT prophylaxis: SQ Lovenox Code Status: Full code, confirmed with patient Family Communication: None present on admission Disposition Plan: Pending chest pain rule out Consults called: Cardiology consulted by EDP Admission status: Observation   Darreld McleanVishal Demitria Hay MD [facility]  If 7PM-7AM, please contact night-coverage [email or web address]  10/14/2018, 1:07 AM

## 2018-10-14 NOTE — Progress Notes (Signed)
ANTICOAGULATION CONSULT NOTE  Pharmacy Consult for Heparin Indication: chest pain/ACS  No Known Allergies  Patient Measurements: Height: 5\' 8"  (172.7 cm) Weight: 185 lb 6.4 oz (84.1 kg) IBW/kg (Calculated) : 68.4 Heparin Dosing Weight: 84.1 kg  Vital Signs: Temp: 98.1 F (36.7 C) (05/16 1456) Temp Source: Oral (05/16 1456) BP: 130/91 (05/16 1456) Pulse Rate: 64 (05/16 1456)  Labs: Recent Labs    10/13/18 1657 10/13/18 2007 10/13/18 2300 10/14/18 0205  HGB 14.2  --   --  14.8  HCT 45.4  --   --  45.5  PLT 440*  --   --  412*  CREATININE 1.13  --   --  1.06  TROPONINI 0.03* 0.09* 0.31*  --    Estimated Creatinine Clearance: 106.7 mL/min (by C-G formula based on SCr of 1.06 mg/dL).  Assessment: 58 yoM presenting with sudden sharp central chest pain associated with numbness of both arms. 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 Pharmacy consulted to dose IV heparin. Patient has been on enoxaparin for VTE prophylaxis - last dose 5/16 @ 0201. CBC WNL, no bleeding noted.  Goal of Therapy:  Heparin level 0.3-0.7 units/ml Monitor platelets by anticoagulation protocol: Yes   Plan:  Will give reduced bolus dose given recent enoxaparin - heparin 2000 units IV x1 Start heparin gtt at 1000 units/hr Check heparin level in 8 hours Daily heparin level and CBC Monitor for s/sx of bleeding  Erin N. Zigmund Daniel, PharmD, BCPS PGY2 Infectious Diseases Pharmacy Resident Phone: [phone] 10/14/2018,4:10 PM

## 2018-10-14 NOTE — Consult Note (Signed)
Cardiology Consultation:   Patient ID: Alex Ortiz MRN: [id number]; DOB: 1985/11/27  Admit date: 10/13/2018 Date of Consult: 10/14/2018  Primary Care Provider: Patient, No Pcp Per Primary Cardiologist: No primary care provider on file.  Primary Electrophysiologist:  None    Patient Profile:   Alex Ortiz is a 33 y.o. male with a history only of mild asthma who presents with an episode of chest pain found to have abnormal troponins.   History of Present Illness:   Alex Ortiz was in his usual state of excellent health until this afternoon when he developed sharp, substernal chest pain after walking home from the store. The pain was associated with a numbness in both of his arm, was not pleuritic in nature, though did appear to be somewhat positional (as he felt better sitting up). He presented to the ER where, while in triage, he felt a squeezing sensation - "like I was having a heart attack".   His ER evaluation has included a CTA for dissection which was negative, lab work up which was largely unremarkable save for his troponins which trended from 0.03 -> 0.09 -> 0.31. CT also without cardiomegaly, pericaridal effusion, or pericardial thickening. No PE. His EKG was unchanged from prior with LVH pattern in the anterior and lateral leads similar to prior. He was given pain medication with some transient improvement in his pain.   On review of system he denies recent fever, nightsweats, or cough. No sick contacts. No recent URI. No LE edema, SOB, orthopnea. No personal history of heart problems. +THC use; denies cocaine.   Past Medical History:  Diagnosis Date  . Asthma     Past Surgical History:  Procedure Laterality Date  . HERNIA REPAIR       Home Medications:  Prior to Admission medications   Not on File    Inpatient Medications: Scheduled Meds: . aspirin EC  81 mg Oral Daily  . enoxaparin (LOVENOX) injection  40 mg Subcutaneous QHS  . oxyCODONE-acetaminophen  2 tablet Oral  Once  . pantoprazole  40 mg Oral Daily   Continuous Infusions:  PRN Meds: acetaminophen, albuterol, ondansetron (ZOFRAN) IV  Allergies:   No Known Allergies  Social History:   Social History   Socioeconomic History  . Marital status: Single    Spouse name: Not on file  . Number of children: Not on file  . Years of education: Not on file  . Highest education level: Not on file  Occupational History  . Not on file  Social Needs  . Financial resource strain: Not on file  . Food insecurity:    Worry: Not on file    Inability: Not on file  . Transportation needs:    Medical: Not on file    Non-medical: Not on file  Tobacco Use  . Smoking status: Current Every Day Smoker    Types: Cigarettes  . Smokeless tobacco: Never Used  . Tobacco comment: 2-3 cigarettes/day  Substance and Sexual Activity  . Alcohol use: Yes    Comment: socially   . Drug use: Yes    Types: Marijuana  . Sexual activity: Not on file  Lifestyle  . Physical activity:    Days per week: Not on file    Minutes per session: Not on file  . Stress: Not on file  Relationships  . Social connections:    Talks on phone: Not on file    Gets together: Not on file    Attends religious service: Not on file  Active member of club or organization: Not on file    Attends meetings of clubs or organizations: Not on file    Relationship status: Not on file  . Intimate partner violence:    Fear of current or ex partner: Not on file    Emotionally abused: Not on file    Physically abused: Not on file    Forced sexual activity: Not on file  Other Topics Concern  . Not on file  Social History Narrative  . Not on file    Family History:    Family History  Problem Relation Age of Onset  . Hypertension Mother   . Heart disease Father        Cardiac bypass in his 7s.     Review of Systems: [y] = yes,  = no     General: Weight gain ; Weight loss ; Anorexia ; Fatigue ; Fever ; Chills ;  Weakness    Cardiac: Chest pain/pressure [place] ]; Resting SOB ; Exertional SOB ; Orthopnea ; Pedal Edema ; Palpitations ; Syncope ; Presyncope ; Paroxysmal nocturnal dyspnea[ ]    Pulmonary: Cough ; Wheezing[ ] ; Hemoptysis[ ] ; Sputum ; Snoring    GI: Vomiting[ ] ; Dysphagia[ ] ; Melena[ ] ; Hematochezia ; Heartburn[ ] ; Abdominal pain ; Constipation ; Diarrhea ; BRBPR    GU: Hematuria[ ] ; Dysuria ; Nocturia[ ]    Vascular: Pain in legs with walking ; Pain in feet with lying flat ; Non-healing sores ; Stroke ; TIA ; Slurred speech ;   Neuro: Headaches[ ] ; Vertigo[ ] ; Seizures[ ] ; Paresthesias[ ] ;Blurred vision ; Diplopia ; Vision changes    Ortho/Skin: Arthritis ; Joint pain ; Muscle pain ; Joint swelling ; Back Pain ; Rash    Psych: Depression[ ] ; Anxiety[ ]    Heme: Bleeding problems ; Clotting disorders ; Anemia    Endocrine: Diabetes ; Thyroid dysfunction[ ]   Physical Exam/Data:   Vitals:   10/13/18 2230 10/13/18 2335 10/14/18 0128 10/14/18 0135  BP: (!) 136/98 116/82  (!) 134/99  Pulse: 68 63  64  Resp: 13 15    Temp:    98.2 F (36.8 C)  TempSrc:    Oral  SpO2: 99% 100%  99%  Weight:   84.1 kg   Height:    (1.727 m)    No intake or output data in the 24 hours ending 10/14/18 0156 Filed Weights   10/13/18 1652 10/14/18 0128  Weight: 84.8 kg 84.1 kg   Body mass index is 28.19 kg/m.  General:  Well nourished, well developed, in no acute distress HEENT: normal Lymph: no adenopathy Neck: no JVD Endocrine:  No thryomegaly Vascular: No carotid bruits; FA pulses 2+ bilaterally without bruits  Cardiac:  normal S1, S2; RRR; no murmurs, no rubs.  Lungs:  clear to auscultation bilaterally, no wheezing, rhonchi or rales  Abd: soft, nontender, no hepatomegaly  Ext: no edema Musculoskeletal:  No deformities, BUE and BLE strength normal and equal Skin: warm and dry   Neuro:  CNs 2-12 intact, no focal abnormalities noted Psych:  Normal affect   EKG:  The EKG was personally reviewed and demonstrates J point elevation in V1, V3, 1, AVL.   Relevant CV Studies: None.   Laboratory Data:  Chemistry Recent Labs  Lab 10/13/18 1657  NA 139  K 3.9  CL 105  CO2 26  GLUCOSE 122*  BUN 13  CREATININE 1.13  CALCIUM 9.5  GFRNONAA >60  GFRAA >60  ANIONGAP 8    No results for input(s): PROT, ALBUMIN, AST, ALT, ALKPHOS, BILITOT in the last 168 hours. Hematology Recent Labs  Lab 10/13/18 1657  WBC 5.4  RBC 4.94  HGB 14.2  HCT 45.4  MCV 91.9  MCH 28.7  MCHC 31.3  RDW 12.4  PLT 440*   Cardiac Enzymes Recent Labs  Lab 10/13/18 1657 10/13/18 2007 10/13/18 2300  TROPONINI 0.03* 0.09* 0.31*   No results for input(s): TROPIPOC in the last 168 hours.  BNPNo results for input(s): BNP, PROBNP in the last 168 hours.  DDimer No results for input(s): DDIMER in the last 168 hours.  Radiology/Studies:  Dg Chest 2 View  Result Date: 10/13/2018 CLINICAL DATA:  Chest pain. EXAM: CHEST - 2 VIEW COMPARISON:  Chest x-ray and CT chest dated September 23, 2016. FINDINGS: The heart size and mediastinal contours are within normal limits. Both lungs are clear. The visualized skeletal structures are unremarkable. IMPRESSION: No active cardiopulmonary disease. Electronically Signed   By: Obie Dredge M.D.   On: 10/13/2018 19:19   Ct Angio Chest/abd/pel For Dissection W And/or Wo Contrast  Result Date: 10/13/2018 CLINICAL DATA:  Chest and abdomen pain EXAM: CT ANGIOGRAPHY CHEST, ABDOMEN AND PELVIS TECHNIQUE: Initially, axial CT images were obtained through the chest without intravenous contrast material administration. Multidetector CT imaging through the chest, abdomen and pelvis was performed using the standard protocol during bolus administration of intravenous contrast. Multiplanar reconstructed images and MIPs were obtained and reviewed to evaluate the vascular  anatomy. CONTRAST:  OMNIPAQUE IOHEXOL 350 MG/ML SOLN COMPARISON:  Chest CT September 23, 2016; chest radiograph Oct 13, 2018 FINDINGS: CTA CHEST FINDINGS Cardiovascular: There is no intramural hematoma within the thoracic aorta on the noncontrast enhanced study. There is no demonstrable thoracic aortic aneurysm or dissection. The visualized great vessels appear unremarkable. Note that the right innominate and left common carotid arteries arise as a common trunk, an anatomic variant. No pulmonary embolus is appreciable. There is no pericardial effusion or pericardial thickening evident. Mediastinum/Nodes: Thyroid appears unremarkable. There is an enlarged right hilar lymph node measuring 1.4 x 1.4 cm. There are scattered subcentimeter mediastinal lymph nodes and axillary lymph nodes. No other lymph node enlargement is evident. There is a small hiatal hernia. Lungs/Pleura: There is mild bibasilar atelectasis. No pneumothorax. No edema or consolidation. No pleural effusion or pleural thickening evident. Musculoskeletal: There are no blastic or lytic bone lesions. No evident fracture or dislocation. No chest wall lesions evident. Review of the MIP images confirms the above findings. CTA ABDOMEN AND PELVIS FINDINGS VASCULAR Aorta: There is no abdominal aortic aneurysm or dissection. No appreciable atherosclerotic change. Celiac: Celiac artery and its branches appear widely patent. No aneurysm or dissection evident. SMA: Superior mesenteric artery and its branches appear widely patent. No aneurysm or dissection evident. Renals: There are 2 renal arteries on each side, with one of the arteries on each side significantly larger than the other. Renal arteries bilaterally appear widely patent in all segments. No aneurysm or dissection. No fibromuscular dysplasia evident on either side. IMA: Inferior mesenteric artery in its branches are widely patent. No aneurysm or dissection evident. Inflow: The major pelvic arterial  vessels are widely patent throughout their respective courses. No appreciable atherosclerotic change noted. No aneurysm or  dissection involving major pelvic arterial vessels. Proximal superficial and profunda femoral arteries are also widely patent without aneurysm or dissection. Veins: No obvious venous abnormality within the limitations of this arterial phase study. Review of the MIP images confirms the above findings. NON-VASCULAR Hepatobiliary: No focal liver lesions are evident. The gallbladder wall is not appreciably thickened. There is no biliary duct dilatation. Pancreas: There is no pancreatic mass or inflammatory focus. Spleen: No splenic lesions are evident. Adrenals/Urinary Tract: Adrenals bilaterally appear normal. Kidneys bilaterally show no evident mass or hydronephrosis on either side. There is no appreciable renal or ureteral calculus. Urinary bladder is midline with wall thickness within normal limits. Stomach/Bowel: There is no appreciable bowel wall or mesenteric thickening. No evident bowel obstruction. Terminal ileum appears unremarkable. No free air or portal venous air evident. Lymphatic: There is no evident adenopathy in the abdomen or pelvis. Reproductive: Prostate and seminal vesicles appear normal in size and contour. No evident pelvic mass. Other: The appendix appears normal. There is no abscess or ascites in the abdomen or pelvis. Musculoskeletal: No evident fracture or dislocation. No blastic or lytic bone lesions. No intramuscular or abdominal wall lesions evident. Review of the MIP images confirms the above findings. IMPRESSION: CT angiogram chest: 1. No thoracic aortic aneurysm or dissection. No intramural hematoma. 2.  No demonstrable pulmonary embolus. 3.  Areas of mild atelectasis.  No lung edema or consolidation. 4. Enlarged right hilar lymph node. No other adenopathy evident. Etiology for this single enlarged lymph node is uncertain. 5.  Small hiatal hernia. CT angiogram  abdomen; CT angiogram pelvis: 1. No aneurysm or dissection involving the aorta, major pelvic, or major mesenteric vessels. No appreciable obstructive disease. No appreciable atherosclerotic calcification. No fibromuscular dysplasia. 2. No evident bowel wall thickening or bowel obstruction. No abscess in the abdomen pelvis. Appendix appears normal. 3. No renal or ureteral calculus. No hydronephrosis on either side. Urinary bladder wall thickness is within normal limits. Electronically Signed   By: Bretta BangWilliam  Woodruff III M.D.   On: 10/13/2018 19:57    Assessment and Plan:   Alex Ortiz is 33 year old healthy man who presents with an episode of chest pain and is found to have abnormal biomarkers. His pre-test probability of coronary disease given his age, comorbidities, and family history remains low. He has had a CTA for dissection that is reassuring and did not demonstrate abnormalities of the pericardial space or a PE. His proximal coronary arteries are not well visualized (per my read) to exclude abnormalities but did not appear heavily calcified. His EKGs, while likely with some LVH, are not suggestive of ongoing ischemia and are relative unchanged from prior. His CP does appear to have a positional component that would be more consistent with a pericarditis like syndrome despite the lack of antecedent viral illness or URI. Agree pain may be more consistent with upper GI etiology, but need to r/o cardiac etiologies given abnormal enzymes.   Thus, recommend: -- Trend troponins q6 hours to peak -- Please order TTE -- If no abnormalities on TTE, would consider Coronary CTA to r/o anomalous coronary or other obstructive lesion.  -- Please send inflammatory markers to risk stratify for pericarditis -- Please send lipid panel and a1c -- Daily EKG  We will continue to follow.   For questions or updates, please contact [facility] Please consult [email or web address] for contact info under   Signed, Laurell JosephsLauren K  Rajni Holsworth, MD  10/14/2018 1:56 AM

## 2018-10-15 DIAGNOSIS — R079 Chest pain, unspecified: Secondary | ICD-10-CM | POA: Diagnosis present

## 2018-10-15 DIAGNOSIS — E7800 Pure hypercholesterolemia, unspecified: Secondary | ICD-10-CM

## 2018-10-15 DIAGNOSIS — I214 Non-ST elevation (NSTEMI) myocardial infarction: Principal | ICD-10-CM

## 2018-10-15 LAB — TROPONIN I
Troponin I: 23.91 ng/mL (ref <0.03)
Troponin I: 19.3 ng/mL (ref <0.03)

## 2018-10-15 LAB — HEPARIN LEVEL (UNFRACTIONATED)
Heparin Unfractionated: 1.32 IU/mL — ABNORMAL HIGH (ref 0.30–0.70)
Heparin Unfractionated: 0.1 IU/mL — ABNORMAL LOW (ref 0.30–0.70)

## 2018-10-15 LAB — APTT: aPTT: 34 seconds (ref 24–36)

## 2018-10-15 MED ORDER — SODIUM CHLORIDE 0.9 % WEIGHT BASED INFUSION: 1.0000 mL/kg/h | INTRAVENOUS | Status: DC

## 2018-10-15 MED ORDER — SODIUM CHLORIDE 0.9% FLUSH: 3.0000 mL | INTRAVENOUS | Status: DC | PRN

## 2018-10-15 MED ORDER — SODIUM CHLORIDE 0.9 % IV SOLN: 250.0000 mL | INTRAVENOUS | Status: DC | PRN

## 2018-10-15 MED ORDER — SODIUM CHLORIDE 0.9 % WEIGHT BASED INFUSION
3.0000 mL/kg/h | INTRAVENOUS | Status: AC
  Administered 2018-10-16: 3 mL/kg/h via INTRAVENOUS

## 2018-10-15 MED ORDER — SODIUM CHLORIDE 0.9% FLUSH
3.0000 mL | Freq: Two times a day (BID) | INTRAVENOUS | Status: DC
  Administered 2018-10-15 – 2018-10-17 (×5): 3 mL via INTRAVENOUS

## 2018-10-15 MED ORDER — HEPARIN BOLUS VIA INFUSION
2000.0000 [IU] | Freq: Once | INTRAVENOUS | Status: AC
  Administered 2018-10-15: 20:00:00 2000 [IU] via INTRAVENOUS
  Filled 2018-10-15: qty 2000

## 2018-10-15 NOTE — Progress Notes (Addendum)
PROGRESS NOTE        PATIENT DETAILS Name: Alex Ortiz Age: 33 y.o. Sex: male Date of Birth: 12-Jun-1985 Admit Date: 10/13/2018 Admitting Physician Charlsie Quest, MD WUJ:[id number], No Pcp Per  Brief Narrative: Patient is a 33 y.o. male with a history of asthma, tobacco use, marijuana use the ED with chest pain, subsequently found to have non-STEMI.  To proceed with LHC on 5/18.  Subjective: Did not want to talk to me when I walked in-subsequently started answering a few questions.  He denies any chest pain or shortness of breath.  His father apparently had a CABG in his 56s.  He seems very upset about the fact that he had to wait in the emergency room for a few hours-claims that he did not get any care there.  Tried to explain that he did not have a STEMI, and has a non-STEMI-and that he has LHC scheduled for tomorrow.  However patient was only focused on the fact that he had to wait several hours in the emergency room before he got any care-as I tried to explain further that he actually " he ruled in" and did not have STEMI-he got more agitated and upset.  He further got more upset about not being told the timing of his procedure tomorrow-tried to explain that this is a very common occurrence of the weekend, and that he should probably know a definite timeframe tomorrow morning.  As we talked more, he he kept on getting more agitated-and it was very clear that there was nothing that I could say to calm him down or to change his mind.  He had refused labs this morning-but apparently later change his mind and consented.  Assessment/Plan: Non-STEMI: Chest pain-free this morning-no shortness of breath as well.  Note CTA chest/abdomen done on admission was negative for dissection or PE.  Continue aspirin/statin/beta-blocker/IV heparin.  Cardiology following-plans for [facility] tomorrow (per cardiology note-patient has not consented yet).    Note-Per prior hospitalist MD-Dr.  Raquel James had apparently threatened to sign out AMA yesterday.  Asthma: Stable without any signs of exacerbation.  Continue with as needed bronchodilators.  Tobacco abuse: We will need counseling prior to discharge-when he is a bit more cooperative (see above)  COVID 19 screen: Negative.  DVT Prophylaxis: Full dose anticoagulation with Heparin  Code Status: Full code or DNR  Family Communication: None at bedside  Disposition Plan: Remain inpatient  Antimicrobial agents: Anti-infectives (From admission, onward)   None      Procedures: 5/17>> echo: EF 40-45%, akinesis of the apical, septal, inferior, anterior, lateral, inferolateral and apical segments.  CONSULTS:  cardiology  Time spent: 25- minutes-Greater than 50% of this time was spent in counseling, explanation of diagnosis, planning of further management, and coordination of care.  MEDICATIONS: Scheduled Meds:  aspirin EC  81 mg Oral Daily   atorvastatin  40 mg Oral q1800   heparin  2,000 Units Intravenous Once   metoprolol tartrate  12.5 mg Oral BID   pantoprazole  40 mg Oral Daily   sodium chloride flush  3 mL Intravenous Q12H   Continuous Infusions:  sodium chloride     [START ON 10/16/2018] sodium chloride     Followed by   Melene Muller ON 10/16/2018] sodium chloride     heparin 1,000 Units/hr (10/15/18 0914)   PRN Meds:.sodium chloride, acetaminophen, albuterol, ondansetron (  ZOFRAN) IV, sodium chloride flush   PHYSICAL EXAM: Vital signs: Vitals:   10/14/18 0128 10/14/18 0135 10/14/18 1456 10/14/18 2053  BP:  (!) 134/99 (!) 130/91 124/80  Pulse:  64 64 72  Resp:   18   Temp:  98.2 F (36.8 C) 98.1 F (36.7 C) 98.8 F (37.1 C)  TempSrc:  Oral Oral Oral  SpO2:  99% 98% 99%  Weight: 84.1 kg     Height: 5\' 8"  (1.727 m)      Filed Weights   10/13/18 1652 10/14/18 0128  Weight: 84.8 kg 84.1 kg   Body mass index is 28.19 kg/m.   General appearance :Awake, alert, not in any distress  but agitated (see above) HEENT: Atraumatic and Normocephalic Neck: supple, no JVD. No cervical lymphadenopathy. No thyromegaly Resp:Good air entry bilaterally, no added sounds  CVS: S1 S2 regular, no murmurs.  GI: Bowel sounds present, Non tender and not distended with no gaurding, rigidity or rebound.No organomegaly Extremities: B/L Lower Ext shows no edema, both legs are warm to touch Neurology:  speech clear,Non focal, sensation is grossly intact. Musculoskeletal:No digital cyanosis Skin:No Rash, warm and dry Wounds:N/A  I have personally reviewed following labs and imaging studies  LABORATORY DATA: CBC: Recent Labs  Lab 10/13/18 1657 10/14/18 0205  WBC 5.4 9.9  HGB 14.2 14.8  HCT 45.4 45.5  MCV 91.9 89.9  PLT 440* 412*    Basic Metabolic Panel: Recent Labs  Lab 10/13/18 1657 10/14/18 0205  NA 139 136  K 3.9 3.9  CL 105 101  CO2 26 23  GLUCOSE 122* 108*  BUN 13 8  CREATININE 1.13 1.06  CALCIUM 9.5 9.5    GFR: Estimated Creatinine Clearance: 106.7 mL/min (by C-G formula based on SCr of 1.06 mg/dL).  Liver Function Tests: No results for input(s): AST, ALT, ALKPHOS, BILITOT, PROT, ALBUMIN in the last 168 hours. No results for input(s): LIPASE, AMYLASE in the last 168 hours. No results for input(s): AMMONIA in the last 168 hours.  Coagulation Profile: No results for input(s): INR, PROTIME in the last 168 hours.  Cardiac Enzymes: Recent Labs  Lab 10/13/18 1657 10/13/18 2007 10/13/18 2300 10/15/18 0445 10/15/18 0728  TROPONINI 0.03* 0.09* 0.31* 23.91* 19.30*    BNP (last 3 results) No results for input(s): PROBNP in the last 8760 hours.  HbA1C: No results for input(s): HGBA1C in the last 72 hours.  CBG: No results for input(s): GLUCAP in the last 168 hours.  Lipid Profile: Recent Labs    10/14/18 0205  CHOL 186  HDL 45  LDLCALC 127*  TRIG 70  CHOLHDL 4.1    Thyroid Function Tests: No results for input(s): TSH, T4TOTAL, FREET4, T3FREE,  THYROIDAB in the last 72 hours.  Anemia Panel: No results for input(s): VITAMINB12, FOLATE, FERRITIN, TIBC, IRON, RETICCTPCT in the last 72 hours.  Urine analysis: No results found for: COLORURINE, APPEARANCEUR, LABSPEC, PHURINE, GLUCOSEU, HGBUR, BILIRUBINUR, KETONESUR, PROTEINUR, UROBILINOGEN, NITRITE, LEUKOCYTESUR  Sepsis Labs: Lactic Acid, Venous No results found for: LATICACIDVEN  MICROBIOLOGY: Recent Results (from the past 240 hour(s))  SARS Coronavirus 2 (CEPHEID - Performed in [facility] hospital lab), Hosp Order     Status: None   Collection Time: 10/13/18  9:32 PM  Result Value Ref Range Status   SARS Coronavirus 2 NEGATIVE NEGATIVE Final    Comment: (NOTE) If result is NEGATIVE SARS-CoV-2 target nucleic acids are NOT DETECTED. The SARS-CoV-2 RNA is generally detectable in upper and lower  respiratory specimens during the  acute phase of infection. The lowest  concentration of SARS-CoV-2 viral copies this assay can detect is 250  copies / mL. A negative result does not preclude SARS-CoV-2 infection  and should not be used as the sole basis for treatment or other  patient management decisions.  A negative result may occur with  improper specimen collection / handling, submission of specimen other  than nasopharyngeal swab, presence of viral mutation(s) within the  areas targeted by this assay, and inadequate number of viral copies  (<250 copies / mL). A negative result must be combined with clinical  observations, patient history, and epidemiological information. If result is POSITIVE SARS-CoV-2 target nucleic acids are DETECTED. The SARS-CoV-2 RNA is generally detectable in upper and lower  respiratory specimens dur ing the acute phase of infection.  Positive  results are indicative of active infection with SARS-CoV-2.  Clinical  correlation with patient history and other diagnostic information is  necessary to determine patient infection status.  Positive results do    not rule out bacterial infection or co-infection with other viruses. If result is PRESUMPTIVE POSTIVE SARS-CoV-2 nucleic acids MAY BE PRESENT.   A presumptive positive result was obtained on the submitted specimen  and confirmed on repeat testing.  While 2019 novel coronavirus  (SARS-CoV-2) nucleic acids may be present in the submitted sample  additional confirmatory testing may be necessary for epidemiological  and / or clinical management purposes  to differentiate between  SARS-CoV-2 and other Sarbecovirus currently known to infect humans.  If clinically indicated additional testing with an alternate test  methodology ([id number]) is advised. The SARS-CoV-2 RNA is generally  detectable in upper and lower respiratory sp ecimens during the acute  phase of infection. The expected result is Negative. Fact Sheet for Patients:  https://www.fda.gov/media/136312/download Fact Sheet for Healthcare Providers: https://www.fda.gov/media/136313/download This test is not yet approved or cleared by the [place] FDA and has been authorized for detection and/or diagnosis of SARS-CoV-2 by FDA under an Emergency Use Authorization (EUA).  This EUA will remain in effect (meaning this test can be used) for the duration of the COVID-19 declaration under Section 564(b)(1) of the Act, 21 U.S.C. section 360bbb-3(b)(1), unless the authorization is terminated or revoked sooner. Performed at [facility], [address] [place], [place] [address]     RADIOLOGY STUDIES/RESULTS: Dg Chest 2 View  Result Date: 10/13/2018 CLINICAL DATA:  Chest pain. EXAM: CHEST - 2 VIEW COMPARISON:  Chest x-ray and CT chest dated September 23, 2016. FINDINGS: The heart size and mediastinal contours are within normal limits. Both lungs are clear. The visualized skeletal structures are unremarkable. IMPRESSION: No active cardiopulmonary disease. Electronically Signed   By: William T Derry M.D.   On: 10/13/2018 19:19   Ct  Angio Chest/abd/pel For Dissection W And/or Wo Contrast  Result Date: 10/13/2018 CLINICAL DATA:  Chest and abdomen pain EXAM: CT ANGIOGRAPHY CHEST, ABDOMEN AND PELVIS TECHNIQUE: Initially, axial CT images were obtained through the chest without intravenous contrast material administration. Multidetector CT imaging through the chest, abdomen and pelvis was performed using the standard protocol during bolus administration of intravenous contrast. Multiplanar reconstructed images and MIPs were obtained and reviewed to evaluate the vascular anatomy. CONTRAST:  <MEASUREMENTMarland Kitc nMarland Kitc nMarland Kitc nMarland Kitc nMarland Kitc nMarland Kitchen> OMNIPAQUE IOHEXOL 350 MG/ML SOLN COMPARISON:  Chest CT September 23, 2016; chest radiograph Oct 13, 2018 FINDINGS: CTA CHEST FINDINGS Cardiovascular: There is no intramural hematoma within the thoracic aorta on the noncontrast enhanced study. There is no demonstrable thoracic aortic aneurysm or dissection. The visualized great  vessels appear unremarkable. Note that the right innominate and left common carotid arteries arise as a common trunk, an anatomic variant. No pulmonary embolus is appreciable. There is no pericardial effusion or pericardial thickening evident. Mediastinum/Nodes: Thyroid appears unremarkable. There is an enlarged right hilar lymph node measuring 1.4 x 1.4 cm. There are scattered subcentimeter mediastinal lymph nodes and axillary lymph nodes. No other lymph node enlargement is evident. There is a small hiatal hernia. Lungs/Pleura: There is mild bibasilar atelectasis. No pneumothorax. No edema or consolidation. No pleural effusion or pleural thickening evident. Musculoskeletal: There are no blastic or lytic bone lesions. No evident fracture or dislocation. No chest wall lesions evident. Review of the MIP images confirms the above findings. CTA ABDOMEN AND PELVIS FINDINGS VASCULAR Aorta: There is no abdominal aortic aneurysm or dissection. No appreciable atherosclerotic change. Celiac: Celiac artery and its branches appear widely patent. No  aneurysm or dissection evident. SMA: Superior mesenteric artery and its branches appear widely patent. No aneurysm or dissection evident. Renals: There are 2 renal arteries on each side, with one of the arteries on each side significantly larger than the other. Renal arteries bilaterally appear widely patent in all segments. No aneurysm or dissection. No fibromuscular dysplasia evident on either side. IMA: Inferior mesenteric artery in its branches are widely patent. No aneurysm or dissection evident. Inflow: The major pelvic arterial vessels are widely patent throughout their respective courses. No appreciable atherosclerotic change noted. No aneurysm or dissection involving major pelvic arterial vessels. Proximal superficial and profunda femoral arteries are also widely patent without aneurysm or dissection. Veins: No obvious venous abnormality within the limitations of this arterial phase study. Review of the MIP images confirms the above findings. NON-VASCULAR Hepatobiliary: No focal liver lesions are evident. The gallbladder wall is not appreciably thickened. There is no biliary duct dilatation. Pancreas: There is no pancreatic mass or inflammatory focus. Spleen: No splenic lesions are evident. Adrenals/Urinary Tract: Adrenals bilaterally appear normal. Kidneys bilaterally show no evident mass or hydronephrosis on either side. There is no appreciable renal or ureteral calculus. Urinary bladder is midline with wall thickness within normal limits. Stomach/Bowel: There is no appreciable bowel wall or mesenteric thickening. No evident bowel obstruction. Terminal ileum appears unremarkable. No free air or portal venous air evident. Lymphatic: There is no evident adenopathy in the abdomen or pelvis. Reproductive: Prostate and seminal vesicles appear normal in size and contour. No evident pelvic mass. Other: The appendix appears normal. There is no abscess or ascites in the abdomen or pelvis. Musculoskeletal: No  evident fracture or dislocation. No blastic or lytic bone lesions. No intramuscular or abdominal wall lesions evident. Review of the MIP images confirms the above findings. IMPRESSION: CT angiogram chest: 1. No thoracic aortic aneurysm or dissection. No intramural hematoma. 2.  No demonstrable pulmonary embolus. 3.  Areas of mild atelectasis.  No lung edema or consolidation. 4. Enlarged right hilar lymph node. No other adenopathy evident. Etiology for this single enlarged lymph node is uncertain. 5.  Small hiatal hernia. CT angiogram abdomen; CT angiogram pelvis: 1. No aneurysm or dissection involving the aorta, major pelvic, or major mesenteric vessels. No appreciable obstructive disease. No appreciable atherosclerotic calcification. No fibromuscular dysplasia. 2. No evident bowel wall thickening or bowel obstruction. No abscess in the abdomen pelvis. Appendix appears normal. 3. No renal or ureteral calculus. No hydronephrosis on either side. Urinary bladder wall thickness is within normal limits. Electronically Signed   By: Bretta Bang III M.D.   On: 10/13/2018 19:57  LOS: 0 days   Jeoffrey Massed, MD  [facility]  If 7PM-7AM, please contact night-coverage  Please page via [email or web address]  Go to [email or web address] and use [place]'s universal password to access. If you do not have the password, please contact the hospital operator.  Locate the [facility] provider you are looking for under [facility] and page to a number that you can be directly reached. If you still have difficulty reaching the provider, please page the [facility] (Director on Call) for the Hospitalists listed on amion for assistance.  10/15/2018, 10:09 AM

## 2018-10-15 NOTE — Progress Notes (Signed)
ANTICOAGULATION CONSULT NOTE  Pharmacy Consult for Heparin Indication: chest pain/ACS  No Known Allergies  Patient Measurements: Height: 5\' 8"  (172.7 cm) Weight: 185 lb 6.4 oz (84.1 kg) IBW/kg (Calculated) : 68.4 Heparin Dosing Weight: 84.1 kg  Labs: Recent Labs    10/13/18 1657  10/13/18 2300 10/14/18 0205 10/15/18 0445 10/15/18 0728 10/15/18 1557  HGB 14.2  --   --  14.8  --   --   --   HCT 45.4  --   --  45.5  --   --   --   PLT 440*  --   --  412*  --   --   --   HEPARINUNFRC  --   --   --   --   --   --  1.32*  CREATININE 1.13  --   --  1.06  --   --   --   TROPONINI 0.03*   < > 0.31*  --  23.91* 19.30*  --    < > = values in this interval not displayed.   Estimated Creatinine Clearance: 106.7 mL/min (by C-G formula based on SCr of 1.06 mg/dL).  Assessment: 66 yoM presenting with sudden sharp central chest pain associated with numbness of both arms. 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 Pharmacy consulted to dose IV heparin. Patient has been on enoxaparin for VTE prophylaxis - last dose 5/16 @ 0201. CBC WNL, no bleeding noted.  Heparin gtt stopped on 5/17 @ 0140 due to patient refusing lab draws/medical care. It was restarted on 5/17 @ 0914 at a rate of 1000 units/hr. Full bolus was not given - unsure how much of bolus patient received. Initial heparin level after restart returned at 1.32. RN unsure of where lab was drawn from. Repeated heparin level, returned 0.1.  Goal of Therapy:  Heparin level 0.3-0.7 units/ml Monitor platelets by anticoagulation protocol: Yes   Plan:  Re-bolus with heparin 2000 units IV x1 Increase heparin gtt to 1250 units/hr Recheck heparin level in 6 hours Daily heparin level and CBC Monitor for s/sx of bleeding  Jobina Maita N. Zigmund Daniel, PharmD, BCPS PGY2 Infectious Diseases Pharmacy Resident Phone:  [phone] 10/15/2018   5:23 PM

## 2018-10-15 NOTE — Progress Notes (Signed)
DAILY PROGRESS NOTE   Patient Name: Alex Ortiz Date of Encounter: 10/15/2018 Cardiologist: No primary care provider on file.  Chief Complaint   No chest pain  Patient Profile   33 yo male with few cardiac risk factors, presented with acute chest pain and elevated troponins.  Subjective   No chest pain overnight - initially refused blood work but then consented this morning - still pending. Requesting copies of all of his records before consenting to any more procedures.   Objective   Vitals:   10/14/18 0128 10/14/18 0135 10/14/18 1456 10/14/18 2053  BP:  (!) 134/99 (!) 130/91 124/80  Pulse:  64 64 72  Resp:   18   Temp:  98.2 F (36.8 C) 98.1 F (36.7 C) 98.8 F (37.1 C)  TempSrc:  Oral Oral Oral  SpO2:  99% 98% 99%  Weight: 84.1 kg     Height:  (1.727 m)       Intake/Output Summary (Last 24 hours) at 10/15/2018 0817 Last data filed at 10/15/2018 0140 Gross per 24 hour  Intake 929.31 ml  Output -  Net 929.31 ml   Filed Weights   10/13/18 1652 10/14/18 0128  Weight: 84.8 kg 84.1 kg    Physical Exam   General appearance: alert, no distress and well-developed, in no distress Neck: no carotid bruit, no JVD and thyroid not enlarged, symmetric, no tenderness/mass/nodules Lungs: clear to auscultation bilaterally Heart: regular rate and rhythm, S1, S2 normal, no murmur, click, rub or gallop Abdomen: soft, non-tender; bowel sounds normal; no masses,  no organomegaly Extremities: extremities normal, atraumatic, no cyanosis or edema Pulses: 2+ and symmetric Skin: Skin color, texture, turgor normal. No rashes or lesions Neurologic: Grossly normal Psych: agitated  Inpatient Medications    Scheduled Meds: . aspirin EC  81 mg Oral Daily  . atorvastatin  40 mg Oral q1800  . heparin  2,000 Units Intravenous Once  . metoprolol tartrate  12.5 mg Oral BID  . pantoprazole  40 mg Oral Daily    Continuous Infusions: . heparin Stopped (10/15/18 0140)    PRN  Meds: acetaminophen, albuterol, ondansetron (ZOFRAN) IV   Labs   Results for orders placed or performed during the hospital encounter of 10/13/18 (from the past 48 hour(s))  Basic metabolic panel     Status: Abnormal   Collection Time: 10/13/18  4:57 PM  Result Value Ref Range   Sodium 139 135 - 145 mmol/L   Potassium 3.9 3.5 - 5.1 mmol/L   Chloride 105 98 - 111 mmol/L   CO2 26 22 - 32 mmol/L   Glucose, Bld 122 (H) 70 - 99 mg/dL   BUN 13 6 - 20 mg/dL   Creatinine, Ser 4.78 0.61 - 1.24 mg/dL   Calcium 9.5 8.9 - 29.5 mg/dL   GFR calc non Af Amer >60 >60 mL/min   GFR calc Af Amer >60 >60 mL/min   Anion gap 8 5 - 15    Comment: Performed at [facility], [address] [place], [place] [address]  CBC     Status: Abnormal   Collection Time: 10/13/18  4:57 PM  Result Value Ref Range   WBC 5.4 4.0 - 10.5 K/uL   RBC 4.94 4.22 - 5.81 MIL/uL   Hemoglobin 14.2 13.0 - 17.0 g/dL   HCT 86.5 78.4 - 69.6 %   MCV 91.9 80.0 - 100.0 fL   MCH 28.7 26.0 - 34.0 pg   MCHC 31.3 30.0 - 36.0  g/dL   RDW 16.1 09.6 - 04.5 %   Platelets 440 (H) 150 - 400 K/uL   nRBC 0.0 0.0 - 0.2 %    Comment: Performed at [facility], [address], [place], [place] [address]  Troponin I - ONCE - STAT     Status: Abnormal   Collection Time: 10/13/18  4:57 PM  Result Value Ref Range   Troponin I 0.03 (HH) <0.03 ng/mL    Comment: CRITICAL RESULT CALLED TO, READ BACK BY AND VERIFIED WITH: T MORRIS,RN 1748 10/13/2018 D BRADLEY Performed at [facility], [address] [place], [place] [address]   Troponin I - Once     Status: Abnormal   Collection Time: 10/13/18  8:07 PM  Result Value Ref Range   Troponin I 0.09 (HH) <0.03 ng/mL    Comment: CRITICAL VALUE NOTED.  VALUE IS CONSISTENT WITH PREVIOUSLY REPORTED AND CALLED VALUE. Performed at [facility], [address], [place], [place] [address]   Urine rapid drug screen (hosp performed)     Status: Abnormal   Collection Time: 10/13/18   8:20 PM  Result Value Ref Range   Opiates NONE DETECTED NONE DETECTED   Cocaine NONE DETECTED NONE DETECTED   Benzodiazepines NONE DETECTED NONE DETECTED   Amphetamines NONE DETECTED NONE DETECTED   Tetrahydrocannabinol POSITIVE (A) NONE DETECTED   Barbiturates NONE DETECTED NONE DETECTED    Comment: (NOTE) DRUG SCREEN FOR MEDICAL PURPOSES ONLY.  IF CONFIRMATION IS NEEDED FOR ANY PURPOSE, NOTIFY LAB WITHIN 5 DAYS. LOWEST DETECTABLE LIMITS FOR URINE DRUG SCREEN Drug Class                     Cutoff (ng/mL) Amphetamine and metabolites    1000 Barbiturate and metabolites    200 Benzodiazepine                 200 Tricyclics and metabolites     300 Opiates and metabolites        300 Cocaine and metabolites        300 THC                            50 Performed at [facility], [address], [place], [place] [address]   SARS Coronavirus 2 (CEPHEID - Performed in [facility] hospital lab), Hosp Order     Status: None   Collection Time: 10/13/18  9:32 PM  Result Value Ref Range   SARS Coronavirus 2 NEGATIVE NEGATIVE    Comment: (NOTE) If result is NEGATIVE SARS-CoV-2 target nucleic acids are NOT DETECTED. The SARS-CoV-2 RNA is generally detectable in upper and lower  respiratory specimens during the acute phase of infection. The lowest  concentration of SARS-CoV-2 viral copies this assay can detect is 250  copies / mL. A negative result does not preclude SARS-CoV-2 infection  and should not be used as the sole basis for treatment or other  patient management decisions.  A negative result may occur with  improper specimen collection / handling, submission of specimen other  than nasopharyngeal swab, presence of viral mutation(s) within the  areas targeted by this assay, and inadequate number of viral copies  (<250 copies / mL). A negative result must be combined with clinical  observations, patient history, and epidemiological information. If result is POSITIVE SARS-CoV-2  target nucleic acids are DETECTED. The SARS-CoV-2 RNA is generally detectable in upper  and lower  respiratory specimens dur ing the acute phase of infection.  Positive  results are indicative of active infection with SARS-CoV-2.  Clinical  correlation with patient history and other diagnostic information is  necessary to determine patient infection status.  Positive results do  not rule out bacterial infection or co-infection with other viruses. If result is PRESUMPTIVE POSTIVE SARS-CoV-2 nucleic acids MAY BE PRESENT.   A presumptive positive result was obtained on the submitted specimen  and confirmed on repeat testing.  While 2019 novel coronavirus  (SARS-CoV-2) nucleic acids may be present in the submitted sample  additional confirmatory testing may be necessary for epidemiological  and / or clinical management purposes  to differentiate between  SARS-CoV-2 and other Sarbecovirus currently known to infect humans.  If clinically indicated additional testing with an alternate test  methodology [phone]) is advised. The SARS-CoV-2 RNA is generally  detectable in upper and lower respiratory sp ecimens during the acute  phase of infection. The expected result is Negative. Fact Sheet for Patients:  BoilerBrush.com.cy Fact Sheet for Healthcare Providers: https://pope.com/ This test is not yet approved or cleared by the [place] FDA and has been authorized for detection and/or diagnosis of SARS-CoV-2 by FDA under an Emergency Use Authorization (EUA).  This EUA will remain in effect (meaning this test can be used) for the duration of the COVID-19 declaration under Section 564(b)(1) of the Act, 21 U.S.C. section 360bbb-3(b)(1), unless the authorization is terminated or revoked sooner. Performed at [facility], [address], [place], [place] [address]   Troponin I - Once-Timed     Status: Abnormal   Collection Time: 10/13/18  11:00 PM  Result Value Ref Range   Troponin I 0.31 (HH) <0.03 ng/mL    Comment: CRITICAL VALUE NOTED.  VALUE IS CONSISTENT WITH PREVIOUSLY REPORTED AND CALLED VALUE. Performed at [facility], [address] [place], [place] [address]   HIV antibody (Routine Testing)     Status: None   Collection Time: 10/14/18  2:05 AM  Result Value Ref Range   HIV Screen 4th Generation wRfx Non Reactive Non Reactive    Comment: (NOTE) Performed At: [facility] [address] [place], [place] [id number] Jolene Schimke MD HY:[id number]   Lipid panel     Status: Abnormal   Collection Time: 10/14/18  2:05 AM  Result Value Ref Range   Cholesterol 186 0 - 200 mg/dL   Triglycerides 70 <952 mg/dL   HDL 45 >84 mg/dL   Total CHOL/HDL Ratio 4.1 RATIO   VLDL 14 0 - 40 mg/dL   LDL Cholesterol 132 (H) 0 - 99 mg/dL    Comment:        Total Cholesterol/HDL:CHD Risk Coronary Heart Disease Risk Table                     Men   Women  1/2 Average Risk   3.4   3.3  Average Risk       5.0   4.4  2 X Average Risk   9.6   7.1  3 X Average Risk  23.4   11.0        Use the calculated Patient Ratio above and the CHD Risk Table to determine the patient's CHD Risk.        ATP III CLASSIFICATION (LDL):  <100     mg/dL   Optimal  440-102  mg/dL   Near or Above  Optimal  130-159  mg/dL   Borderline  324-401  mg/dL   High  >027     mg/dL   Very High Performed at [facility], [address], [place], [place] [address]   CBC     Status: Abnormal   Collection Time: 10/14/18  2:05 AM  Result Value Ref Range   WBC 9.9 4.0 - 10.5 K/uL   RBC 5.06 4.22 - 5.81 MIL/uL   Hemoglobin 14.8 13.0 - 17.0 g/dL   HCT 44.0 34.7 - 42.5 %   MCV 89.9 80.0 - 100.0 fL   MCH 29.2 26.0 - 34.0 pg   MCHC 32.5 30.0 - 36.0 g/dL   RDW 95.6 38.7 - 56.4 %   Platelets 412 (H) 150 - 400 K/uL   nRBC 0.0 0.0 - 0.2 %    Comment: Performed at [facility], [address] [place], [place] [address]   Basic metabolic panel     Status: Abnormal   Collection Time: 10/14/18  2:05 AM  Result Value Ref Range   Sodium 136 135 - 145 mmol/L   Potassium 3.9 3.5 - 5.1 mmol/L   Chloride 101 98 - 111 mmol/L   CO2 23 22 - 32 mmol/L   Glucose, Bld 108 (H) 70 - 99 mg/dL   BUN 8 6 - 20 mg/dL   Creatinine, Ser 1.88 0.61 - 1.24 mg/dL   Calcium 9.5 8.9 - 41.6 mg/dL   GFR calc non Af Amer >60 >60 mL/min   GFR calc Af Amer >60 >60 mL/min   Anion gap 12 5 - 15    Comment: Performed at [facility], [address], [place], [place] [address]    ECG   Sinus rhythm with evolving MI pattern, Q waves - recent (personally reviewed)  Telemetry   Sinus rhythm- Personally Reviewed  Radiology    Dg Chest 2 View  Result Date: 10/13/2018 CLINICAL DATA:  Chest pain. EXAM: CHEST - 2 VIEW COMPARISON:  Chest x-ray and CT chest dated September 23, 2016. FINDINGS: The heart size and mediastinal contours are within normal limits. Both lungs are clear. The visualized skeletal structures are unremarkable. IMPRESSION: No active cardiopulmonary disease. Electronically Signed   By: Obie Dredge M.D.   On: 10/13/2018 19:19   Ct Angio Chest/abd/pel For Dissection W And/or Wo Contrast  Result Date: 10/13/2018 CLINICAL DATA:  Chest and abdomen pain EXAM: CT ANGIOGRAPHY CHEST, ABDOMEN AND PELVIS TECHNIQUE: Initially, axial CT images were obtained through the chest without intravenous contrast material administration. Multidetector CT imaging through the chest, abdomen and pelvis was performed using the standard protocol during bolus administration of intravenous contrast. Multiplanar reconstructed images and MIPs were obtained and reviewed to evaluate the vascular anatomy. CONTRAST:  OMNIPAQUE IOHEXOL 350 MG/ML SOLN COMPARISON:  Chest CT September 23, 2016; chest radiograph Oct 13, 2018 FINDINGS: CTA CHEST FINDINGS Cardiovascular: There is no intramural hematoma within the thoracic aorta on the noncontrast enhanced study.  There is no demonstrable thoracic aortic aneurysm or dissection. The visualized great vessels appear unremarkable. Note that the right innominate and left common carotid arteries arise as a common trunk, an anatomic variant. No pulmonary embolus is appreciable. There is no pericardial effusion or pericardial thickening evident. Mediastinum/Nodes: Thyroid appears unremarkable. There is an enlarged right hilar lymph node measuring 1.4 x 1.4 cm. There are scattered subcentimeter mediastinal lymph nodes and axillary lymph nodes. No other lymph node enlargement is evident. There is a small hiatal  hernia. Lungs/Pleura: There is mild bibasilar atelectasis. No pneumothorax. No edema or consolidation. No pleural effusion or pleural thickening evident. Musculoskeletal: There are no blastic or lytic bone lesions. No evident fracture or dislocation. No chest wall lesions evident. Review of the MIP images confirms the above findings. CTA ABDOMEN AND PELVIS FINDINGS VASCULAR Aorta: There is no abdominal aortic aneurysm or dissection. No appreciable atherosclerotic change. Celiac: Celiac artery and its branches appear widely patent. No aneurysm or dissection evident. SMA: Superior mesenteric artery and its branches appear widely patent. No aneurysm or dissection evident. Renals: There are 2 renal arteries on each side, with one of the arteries on each side significantly larger than the other. Renal arteries bilaterally appear widely patent in all segments. No aneurysm or dissection. No fibromuscular dysplasia evident on either side. IMA: Inferior mesenteric artery in its branches are widely patent. No aneurysm or dissection evident. Inflow: The major pelvic arterial vessels are widely patent throughout their respective courses. No appreciable atherosclerotic change noted. No aneurysm or dissection involving major pelvic arterial vessels. Proximal superficial and profunda femoral arteries are also widely patent without aneurysm or  dissection. Veins: No obvious venous abnormality within the limitations of this arterial phase study. Review of the MIP images confirms the above findings. NON-VASCULAR Hepatobiliary: No focal liver lesions are evident. The gallbladder wall is not appreciably thickened. There is no biliary duct dilatation. Pancreas: There is no pancreatic mass or inflammatory focus. Spleen: No splenic lesions are evident. Adrenals/Urinary Tract: Adrenals bilaterally appear normal. Kidneys bilaterally show no evident mass or hydronephrosis on either side. There is no appreciable renal or ureteral calculus. Urinary bladder is midline with wall thickness within normal limits. Stomach/Bowel: There is no appreciable bowel wall or mesenteric thickening. No evident bowel obstruction. Terminal ileum appears unremarkable. No free air or portal venous air evident. Lymphatic: There is no evident adenopathy in the abdomen or pelvis. Reproductive: Prostate and seminal vesicles appear normal in size and contour. No evident pelvic mass. Other: The appendix appears normal. There is no abscess or ascites in the abdomen or pelvis. Musculoskeletal: No evident fracture or dislocation. No blastic or lytic bone lesions. No intramuscular or abdominal wall lesions evident. Review of the MIP images confirms the above findings. IMPRESSION: CT angiogram chest: 1. No thoracic aortic aneurysm or dissection. No intramural hematoma. 2.  No demonstrable pulmonary embolus. 3.  Areas of mild atelectasis.  No lung edema or consolidation. 4. Enlarged right hilar lymph node. No other adenopathy evident. Etiology for this single enlarged lymph node is uncertain. 5.  Small hiatal hernia. CT angiogram abdomen; CT angiogram pelvis: 1. No aneurysm or dissection involving the aorta, major pelvic, or major mesenteric vessels. No appreciable obstructive disease. No appreciable atherosclerotic calcification. No fibromuscular dysplasia. 2. No evident bowel wall thickening or  bowel obstruction. No abscess in the abdomen pelvis. Appendix appears normal. 3. No renal or ureteral calculus. No hydronephrosis on either side. Urinary bladder wall thickness is within normal limits. Electronically Signed   By: Bretta Bang III M.D.   On: 10/13/2018 19:57    Cardiac Studies   Procedure: 2D Echo  Indications:    Chest pain 786.50   History:        Patient has no prior history of Echocardiogram examinations.                 Signs/Symptoms: Chest Pain and elevated troponin.   Sonographer:    Delcie Roch Referring Phys: [id number] VISHAL R PATEL  IMPRESSIONS  1. The left ventricle has mild-moderately reduced systolic function, with an ejection fraction of 40-45%. The cavity size was normal. There is akinesis of the apical septal, inferior, anterior, lateral, inferolateral and apical segments. Left  ventricular diastolic parameters were normal.  2. The right ventricle has normal systolic function. The cavity was normal. There is no increase in right ventricular wall thickness.  3. The aortic valve is grossly normal.  Assessment   Principal Problem:   Non-ST elevation (NSTEMI) myocardial infarction [facility](HCC) Active Problems:   Asthma   Hyperlipidemia   Plan   Mr. Monreal had no further chest pain overnight. I discussed our desire for him to have a left heart catheterization tomorrow. I covered the procedure in detail, including risks, benefits and alternatives with him. Since he will be added on to the cases from the weekend, I could not give him an exact time which upset him. He did not yet expressly sign consent for the procedure. Our team will review with him tomorrow. Please keep NPO p MN. Cath orders have been placed for tentative procedure tomorrow.  Time Spent Directly with Patient:  I have spent a total of 25 minutes with the patient reviewing hospital notes, telemetry, EKGs, labs and examining the patient as well as establishing an assessment and  plan that was discussed personally with the patient.  > 50% of time was spent in direct patient care.  Length of Stay:  LOS: 0 days   Chrystie NoseKenneth C. Jamielyn Petrucci, MD, [facility] LLCFACC, FACP  [facility]  Medical Director of the [facility]  [facility]ovascular Risk [facility] Diplomate of the American Board of Clinical Lipidology Attending Cardiologist  Direct Dial: [phone]  Fax: [phone]  Website:  [email or web address]  Dallis Darden C Hanley Woerner 10/15/2018, 8:17 AM

## 2018-10-15 NOTE — Progress Notes (Signed)
ANTICOAGULATION CONSULT NOTE  Pharmacy Consult for Heparin Indication: chest pain/ACS  No Known Allergies  Patient Measurements: Height: 5\' 8"  (172.7 cm) Weight: 185 lb 6.4 oz (84.1 kg) IBW/kg (Calculated) : 68.4 Heparin Dosing Weight: 84.1 kg  Vital Signs:    Labs: Recent Labs    10/13/18 1657  10/13/18 2300 10/14/18 0205 10/15/18 0445 10/15/18 0728  HGB 14.2  --   --  14.8  --   --   HCT 45.4  --   --  45.5  --   --   PLT 440*  --   --  412*  --   --   CREATININE 1.13  --   --  1.06  --   --   TROPONINI 0.03*   < > 0.31*  --  23.91* 19.30*   < > = values in this interval not displayed.   Estimated Creatinine Clearance: 106.7 mL/min (by C-G formula based on SCr of 1.06 mg/dL).  Assessment: 46 yoM presenting with sudden sharp central chest pain associated with numbness of both arms. 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 Pharmacy consulted to dose IV heparin. Patient has been on enoxaparin for VTE prophylaxis - last dose 5/16 @ 0201. CBC WNL, no bleeding noted.  Heparin gtt stopped on 5/16 @ 0140 due to patient refusing lab draws/medical care. It has since been restarted on 5/17 @ 0914 at a rate of 1000 units/hr. Full bolus was not given yesterday - unsure how much of bolus patient received. Will check heparin level in 6 hours to determine need for rebolus.   Goal of Therapy:  Heparin level 0.3-0.7 units/ml Monitor platelets by anticoagulation protocol: Yes   Plan:  Re-start heparin gtt at 1000 units/hr Check heparin level in 6 hours Daily heparin level and CBC Monitor for s/sx of bleeding  Gwynneth Albright, Ilda Basset D PGY1 Pharmacy Resident  Phone [phone] 10/15/2018   9:54 AM

## 2018-10-15 NOTE — Progress Notes (Signed)
Phlebotomy tech attempted to collect blood from patient for Troponin and Heparin level. Pt upset he was awaken for labs and refused to allow blood to be drawn. This RN talked with patient and educated pt why he needs the heparin and also explained why we needed labs drawn while on this medication. Pt continued to refuse labs and advised we can turn off the medication if needed. This RN called Cardiology and on call Cardiologist came to see patient to also educate. Pt still refuses and this RN stopped Heparin as advised by on call Cardiologist. This RN called on call Hospitalist to advise of the situation. Pharmacy also notified.

## 2018-10-16 ENCOUNTER — Encounter (HOSPITAL_COMMUNITY): Payer: Self-pay | Admitting: Cardiovascular Disease

## 2018-10-16 ENCOUNTER — Inpatient Hospital Stay (HOSPITAL_COMMUNITY)
Admission: EM | Disposition: A | Discharge status: Home / Self Care | Payer: Self-pay | Source: Home / Self Care | Attending: Cardiovascular Disease

## 2018-10-16 DIAGNOSIS — I208 Other forms of angina pectoris: Secondary | ICD-10-CM | POA: Diagnosis present

## 2018-10-16 DIAGNOSIS — I2510 Atherosclerotic heart disease of native coronary artery without angina pectoris: Secondary | ICD-10-CM

## 2018-10-16 HISTORY — PX: LEFT HEART CATH AND CORONARY ANGIOGRAPHY: CATH118249

## 2018-10-16 LAB — COMPREHENSIVE METABOLIC PANEL
ALT: 33 U/L (ref 0–44)
AST: 58 U/L — ABNORMAL HIGH (ref 15–41)
Albumin: 3.9 g/dL (ref 3.5–5.0)
Alkaline Phosphatase: 52 U/L (ref 38–126)
Anion gap: 13 (ref 5–15)
BUN: 12 mg/dL (ref 6–20)
CO2: 23 mmol/L (ref 22–32)
Calcium: 9.5 mg/dL (ref 8.9–10.3)
Chloride: 100 mmol/L (ref 98–111)
Creatinine, Ser: 1.28 mg/dL — ABNORMAL HIGH (ref 0.61–1.24)
GFR calc Af Amer: >60 mL/min (ref >60)
GFR calc non Af Amer: >60 mL/min (ref >60)
Glucose, Bld: 98 mg/dL (ref 70–99)
Potassium: 3.5 mmol/L (ref 3.5–5.1)
Sodium: 136 mmol/L (ref 135–145)
Total Bilirubin: 1.6 mg/dL — ABNORMAL HIGH (ref 0.3–1.2)
Total Protein: 7.1 g/dL (ref 6.5–8.1)

## 2018-10-16 LAB — CBC
HCT: 45.5 % (ref 39.0–52.0)
HCT: 44.4 % (ref 39.0–52.0)
Hemoglobin: 14.7 g/dL (ref 13.0–17.0)
Hemoglobin: 14.4 g/dL (ref 13.0–17.0)
MCH: 29 pg (ref 26.0–34.0)
MCH: 29 pg (ref 26.0–34.0)
MCHC: 32.3 g/dL (ref 30.0–36.0)
MCHC: 32.4 g/dL (ref 30.0–36.0)
MCV: 89.7 fL (ref 80.0–100.0)
MCV: 89.5 fL (ref 80.0–100.0)
Platelets: 415 10*3/uL — ABNORMAL HIGH (ref 150–400)
Platelets: 436 10*3/uL — ABNORMAL HIGH (ref 150–400)
RBC: 5.07 MIL/uL (ref 4.22–5.81)
RBC: 4.96 MIL/uL (ref 4.22–5.81)
RDW: 12.3 % (ref 11.5–15.5)
RDW: 12.1 % (ref 11.5–15.5)
WBC: 7.3 10*3/uL (ref 4.0–10.5)
WBC: 6.7 10*3/uL (ref 4.0–10.5)
nRBC: 0 % (ref 0.0–0.2)
nRBC: 0 % (ref 0.0–0.2)

## 2018-10-16 LAB — HEPARIN LEVEL (UNFRACTIONATED): Heparin Unfractionated: 0.41 IU/mL (ref 0.30–0.70)

## 2018-10-16 SURGERY — LEFT HEART CATH AND CORONARY ANGIOGRAPHY
Anesthesia: LOCAL

## 2018-10-16 MED ORDER — HEPARIN (PORCINE) IN NACL 1000-0.9 UT/500ML-% IV SOLN
INTRAVENOUS | Status: AC
  Filled 2018-10-16: qty 500

## 2018-10-16 MED ORDER — TIROFIBAN (AGGRASTAT) BOLUS VIA INFUSION
INTRAVENOUS | Status: DC | PRN
  Administered 2018-10-16: 11:00:00 2062.5 ug via INTRAVENOUS

## 2018-10-16 MED ORDER — HEPARIN SODIUM (PORCINE) 1000 UNIT/ML IJ SOLN
INTRAMUSCULAR | Status: AC
  Filled 2018-10-16: qty 1

## 2018-10-16 MED ORDER — LABETALOL HCL 5 MG/ML IV SOLN: 10.0000 mg | INTRAVENOUS | Status: AC | PRN

## 2018-10-16 MED ORDER — MIDAZOLAM HCL 2 MG/2ML IJ SOLN
INTRAMUSCULAR | Status: DC | PRN
  Administered 2018-10-16: 1 mg via INTRAVENOUS

## 2018-10-16 MED ORDER — LIDOCAINE HCL (PF) 1 % IJ SOLN
INTRAMUSCULAR | Status: DC | PRN
  Administered 2018-10-16: 2 mL

## 2018-10-16 MED ORDER — ACETAMINOPHEN 325 MG PO TABS: 650.0000 mg | ORAL_TABLET | ORAL | Status: DC | PRN

## 2018-10-16 MED ORDER — TIROFIBAN HCL IN NACL 5-0.9 MG/100ML-% IV SOLN
0.1500 ug/kg/min | INTRAVENOUS | Status: AC
  Administered 2018-10-16 – 2018-10-17 (×5): 0.15 ug/kg/min via INTRAVENOUS
  Filled 2018-10-16 (×7): qty 100

## 2018-10-16 MED ORDER — FENTANYL CITRATE (PF) 100 MCG/2ML IJ SOLN
INTRAMUSCULAR | Status: DC | PRN
  Administered 2018-10-16: 25 ug via INTRAVENOUS

## 2018-10-16 MED ORDER — ONDANSETRON HCL 4 MG/2ML IJ SOLN: 4.0000 mg | Freq: Four times a day (QID) | INTRAMUSCULAR | Status: DC | PRN

## 2018-10-16 MED ORDER — VERAPAMIL HCL 2.5 MG/ML IV SOLN
INTRAVENOUS | Status: AC
  Filled 2018-10-16: qty 2

## 2018-10-16 MED ORDER — CLOPIDOGREL BISULFATE 75 MG PO TABS
75.0000 mg | ORAL_TABLET | Freq: Every day | ORAL | Status: DC
  Administered 2018-10-17 – 2018-10-19 (×3): 75 mg via ORAL
  Filled 2018-10-16 (×4): qty 1

## 2018-10-16 MED ORDER — SODIUM CHLORIDE 0.9 % IV SOLN: 250.0000 mL | INTRAVENOUS | Status: DC | PRN

## 2018-10-16 MED ORDER — NITROGLYCERIN 1 MG/10 ML FOR IR/CATH LAB
INTRA_ARTERIAL | Status: AC
  Filled 2018-10-16: qty 10

## 2018-10-16 MED ORDER — MIDAZOLAM HCL 2 MG/2ML IJ SOLN
INTRAMUSCULAR | Status: AC
  Filled 2018-10-16: qty 2

## 2018-10-16 MED ORDER — ASPIRIN 81 MG PO CHEW: 81.0000 mg | CHEWABLE_TABLET | Freq: Every day | ORAL | Status: DC

## 2018-10-16 MED ORDER — SODIUM CHLORIDE 0.9% FLUSH
3.0000 mL | Freq: Two times a day (BID) | INTRAVENOUS | Status: DC
  Administered 2018-10-16 – 2018-10-18 (×4): 3 mL via INTRAVENOUS

## 2018-10-16 MED ORDER — ATORVASTATIN CALCIUM 80 MG PO TABS
80.0000 mg | ORAL_TABLET | Freq: Every day | ORAL | Status: DC
  Administered 2018-10-16 – 2018-10-18 (×3): 80 mg via ORAL
  Filled 2018-10-16 (×3): qty 1

## 2018-10-16 MED ORDER — HEPARIN SODIUM (PORCINE) 1000 UNIT/ML IJ SOLN
INTRAMUSCULAR | Status: DC | PRN
  Administered 2018-10-16: 1000 [IU] via INTRAVENOUS
  Administered 2018-10-16: 4000 [IU] via INTRAVENOUS

## 2018-10-16 MED ORDER — TIROFIBAN HCL IN NACL 5-0.9 MG/100ML-% IV SOLN
INTRAVENOUS | Status: AC
  Filled 2018-10-16: qty 100

## 2018-10-16 MED ORDER — LIDOCAINE HCL (PF) 1 % IJ SOLN
INTRAMUSCULAR | Status: AC
  Filled 2018-10-16: qty 30

## 2018-10-16 MED ORDER — HYDRALAZINE HCL 20 MG/ML IJ SOLN: 10.0000 mg | INTRAMUSCULAR | Status: AC | PRN

## 2018-10-16 MED ORDER — IOHEXOL 350 MG/ML SOLN
INTRAVENOUS | Status: DC | PRN
  Administered 2018-10-16: 11:00:00 55 mL via INTRAVENOUS

## 2018-10-16 MED ORDER — HEPARIN (PORCINE) IN NACL 1000-0.9 UT/500ML-% IV SOLN
INTRAVENOUS | Status: DC | PRN
  Administered 2018-10-16 (×2): 500 mL

## 2018-10-16 MED ORDER — MORPHINE SULFATE (PF) 2 MG/ML IV SOLN: 2.0000 mg | INTRAVENOUS | Status: DC | PRN

## 2018-10-16 MED ORDER — FENTANYL CITRATE (PF) 100 MCG/2ML IJ SOLN
INTRAMUSCULAR | Status: AC
  Filled 2018-10-16: qty 2

## 2018-10-16 MED ORDER — ASPIRIN 81 MG PO CHEW
81.0000 mg | CHEWABLE_TABLET | Freq: Once | ORAL | Status: AC
  Administered 2018-10-16: 04:00:00 81 mg via ORAL
  Filled 2018-10-16: qty 1

## 2018-10-16 MED ORDER — TIROFIBAN HCL IN NACL 5-0.9 MG/100ML-% IV SOLN
INTRAVENOUS | Status: AC | PRN
  Administered 2018-10-16: 0.15 ug/kg/min via INTRAVENOUS

## 2018-10-16 MED ORDER — VERAPAMIL HCL 2.5 MG/ML IV SOLN
INTRAVENOUS | Status: DC | PRN
  Administered 2018-10-16: 11:00:00 via INTRA_ARTERIAL

## 2018-10-16 MED ORDER — SODIUM CHLORIDE 0.9 % IV SOLN
INTRAVENOUS | Status: AC
  Administered 2018-10-16: 14:00:00 via INTRAVENOUS

## 2018-10-16 MED ORDER — SODIUM CHLORIDE 0.9% FLUSH: 3.0000 mL | INTRAVENOUS | Status: DC | PRN

## 2018-10-16 SURGICAL SUPPLY — 12 items
CATH INFINITI 5FR ANG PIGTAIL (CATHETERS) ×2 IMPLANT
CATH OPTITORQUE TIG 4.0 5F (CATHETERS) ×2 IMPLANT
DEVICE RAD COMP TR BAND LRG (VASCULAR PRODUCTS) ×2 IMPLANT
GLIDESHEATH SLEND A-KIT 6F 22G (SHEATH) ×2 IMPLANT
GUIDEWIRE INQWIRE 1.5J.035X260 (WIRE) ×1 IMPLANT
INQWIRE 1.5J .035X260CM (WIRE) ×2
KIT HEART LEFT (KITS) ×2 IMPLANT
PACK CARDIAC CATHETERIZATION (CUSTOM PROCEDURE TRAY) ×2 IMPLANT
SYR MEDRAD MARK 7 150ML (SYRINGE) ×2 IMPLANT
TRANSDUCER W/STOPCOCK (MISCELLANEOUS) ×2 IMPLANT
TUBING CIL FLEX 10 FLL-RA (TUBING) ×2 IMPLANT
WIRE HI TORQ VERSACORE-J 145CM (WIRE) ×2 IMPLANT

## 2018-10-16 NOTE — Progress Notes (Addendum)
ANTICOAGULATION CONSULT NOTE  Pharmacy Consult for Tirofiban Indication: chest pain/ACS  No Known Allergies  Patient Measurements: Height: 5\' 8"  (172.7 cm) Weight: 181 lb 12.8 oz (82.5 kg) IBW/kg (Calculated) : 68.4  Labs: Recent Labs    10/13/18 2300  10/14/18 0205 10/15/18 0445 10/15/18 0728 10/15/18 1557 10/15/18 1820 10/16/18 0256 10/16/18 1932  HGB  --    < > 14.8  --   --   --   --  14.7 14.4  HCT  --   --  45.5  --   --   --   --  45.5 44.4  PLT  --   --  412*  --   --   --   --  415* 436*  APTT  --   --   --   --   --   --  34  --   --   HEPARINUNFRC  --   --   --   --   --  1.32* 0.10* 0.41  --   CREATININE  --   --  1.06  --   --   --   --  1.28*  --   TROPONINI 0.31*  --   --  23.91* 19.30*  --   --   --   --    < > = values in this interval not displayed.   Estimated Creatinine Clearance: 87.5 mL/min (A) (by C-G formula based on SCr of 1.28 mg/dL (H)).  Assessment: 33 yo M presenting with sudden sharp central chest pain associated with numbness of both arms. 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 Pharmacy was consulted to dose IV heparin, which has been discontinued.   Tirofiban was initiated in cath lab today and will be continuing for 36 hrs until pt returns to cath lab.   Plan:  Continue to monitor CBC and signs/symptoms of bleeding Monitor for s/sx of bleeding  Vicki Mallet, PharmD, BCPS, [facility] 10/16/2018   8:34 PM

## 2018-10-16 NOTE — Progress Notes (Signed)
ANTICOAGULATION CONSULT NOTE - Follow Up Consult  Pharmacy Consult for heparin Indication: NSTEMI  Labs: Recent Labs    10/13/18 1657  10/13/18 2300 10/14/18 0205 10/15/18 0445 10/15/18 0728 10/15/18 1557 10/15/18 1820 10/16/18 0256  HGB 14.2  --   --  14.8  --   --   --   --  14.7  HCT 45.4  --   --  45.5  --   --   --   --  45.5  PLT 440*  --   --  412*  --   --   --   --  415*  APTT  --   --   --   --   --   --   --  34  --   HEPARINUNFRC  --   --   --   --   --   --  1.32* 0.10* 0.41  CREATININE 1.13  --   --  1.06  --   --   --   --   --   TROPONINI 0.03*   < > 0.31*  --  23.91* 19.30*  --   --   --    < > = values in this interval not displayed.    Assessment/Plan:  33yo male therapeutic on heparin after rate change. Will continue gtt at current rate and confirm stable with additional level.   Vernard Gambles, PharmD, BCPS  10/16/2018,3:35 AM

## 2018-10-16 NOTE — Progress Notes (Signed)
 The patient has been seen in conjunction with [name], NP. All aspects of care have been considered and discussed. The patient has been personally interviewed, examined, and all clinical data has been reviewed.   After review likely type I myocardial infarction related to coronary artery dissection.  Rule out Takosubo and premature coronary atherosclerosis with plaque rupture.  Slightly elevated platelet count raises possibility of thrombophilia although this does not usually occur until platelet count is greater than 1 million.  Coronary angiography including risks were discussed with the patient and accepted.  Described risks include stroke, death, myocardial infarction, bleeding, kidney injury, among others.  Progress Note  Patient Name: Alex Ortiz Date of Encounter: 10/16/2018  Primary Cardiologist: Kenneth C Hilty, MD   Subjective   Presented with prolonged chest pain and according to the patient was ignored in the emergency room despite severe ongoing chest pain.  He is still quite aggravated by initial abdominal fullness and suspicion concerning his presentation.  He gets aggravated and very emotional when he describes the treatment.  Not currently having chest pain.  Total duration of chest pain 12 hours.  Inpatient Medications    Scheduled Meds: . aspirin EC  81 mg Oral Daily  . atorvastatin  40 mg Oral q1800  . metoprolol tartrate  12.5 mg Oral BID  . pantoprazole  40 mg Oral Daily  . sodium chloride flush  3 mL Intravenous Q12H   Continuous Infusions: . sodium chloride    . sodium chloride 1 mL/kg/hr (10/16/18 0458)  . heparin 1,250 Units/hr (10/16/18 0430)   PRN Meds: sodium chloride, acetaminophen, albuterol, ondansetron (ZOFRAN) IV, sodium chloride flush   Vital Signs    Vitals:   10/14/18 2053 10/15/18 2046 10/16/18 0402 10/16/18 0404  BP: 124/80 127/80 117/73   Pulse: 72 76 66   Resp:  20 14   Temp: 98.8 F (37.1 C) 99 F (37.2 C) 98.1 F (36.7  C)   TempSrc: Oral Oral Oral   SpO2: 99% 96% 98%   Weight:    82.5 kg  Height:        Intake/Output Summary (Last 24 hours) at 10/16/2018 0756 Last data filed at 10/16/2018 0458 Gross per 24 hour  Intake 943.49 ml  Output -  Net 943.49 ml   Last 3 Weights 10/16/2018 10/14/2018 10/13/2018  Weight (lbs) 181 lb 12.8 oz 185 lb 6.4 oz 187 lb  Weight (kg) 82.464 kg 84.097 kg 84.823 kg      Telemetry    nsr - Personally Reviewed  ECG    Anterolateral T wave changes I, aVL, V1 through V3.- Personally Reviewed  Physical Exam  Healthy-appearing young African-American male. GEN: No acute distress.   Neck: No JVD Cardiac:  No auscultation performed.  2+ bilateral radial Respiratory: Clear to auscultation bilaterally. GI: Soft, nontender, non-distended  MS: No edema; No deformity. Neuro:  Nonfocal  Psych: Normal affect   Labs    Chemistry Recent Labs  Lab 10/13/18 1657 10/14/18 0205 10/16/18 0256  NA 139 136 136  K 3.9 3.9 3.5  CL 105 101 100  CO2 26 23 23  GLUCOSE 122* 108* 98  BUN 13 8 12  CREATININE 1.13 1.06 1.28*  CALCIUM 9.5 9.5 9.5  PROT  --   --  7.1  ALBUMIN  --   --  3.9  AST  --   --  58*  ALT  --   --  33  ALKPHOS  --   --    52  BILITOT  --   --  1.6*  GFRNONAA >60 >60 >60  GFRAA >60 >60 >60  ANIONGAP 8 12 13      Hematology Recent Labs  Lab 10/13/18 1657 10/14/18 0205 10/16/18 0256  WBC 5.4 9.9 7.3  RBC 4.94 5.06 5.07  HGB 14.2 14.8 14.7  HCT 45.4 45.5 45.5  MCV 91.9 89.9 89.7  MCH 28.7 29.2 29.0  MCHC 31.3 32.5 32.3  RDW 12.4 12.3 12.3  PLT 440* 412* 415*    Cardiac Enzymes Recent Labs  Lab 10/13/18 2007 10/13/18 2300 10/15/18 0445 10/15/18 0728  TROPONINI 0.09* 0.31* 23.91* 19.30*   No results for input(s): TROPIPOC in the last 168 hours.   BNPNo results for input(s): BNP, PROBNP in the last 168 hours.   DDimer No results for input(s): DDIMER in the last 168 hours.   Radiology    No results found.  Cardiac Studies    10/14/18  IMPRESSIONS   1. The left ventricle has mild-moderately reduced systolic function, with an ejection fraction of 40-45%. The cavity size was normal. There is akinesis of the apical septal, inferior, anterior, lateral, inferolateral and apical segments. Left  ventricular diastolic parameters were normal.  2. The right ventricle has normal systolic function. The cavity was normal. There is no increase in right ventricular wall thickness.  3. The aortic valve is grossly normal.  Patient Profile     33 y.o. male with PMH of tobacco/marijuana use and asthma who presented with chest pain and NSTEMI.   Assessment & Plan    1. NSTEMI: troponin peaked at 23.91. EKG with evolving changes and TWI in anterolateral leads. Remains on IV heparin. Ideally would need a cardiac cath but did not sign consent yesterday. Will inform MD to follow up today with patient regarding plan.  -- on ASA, statin, and BB  2. HL: LDL 127. On statin therapy  3. Acute systolic HF: EF noted at 40-45% on echo. Now on BB therapy. Consider adding ARB post cath pending renal function.  4. Tobacco use: cessation counseling per nursing.   For questions or updates, please contact [facility] Please consult [email or web address] for contact info under        Signed, [name], NP  10/16/2018, 7:56 AM

## 2018-10-16 NOTE — Progress Notes (Signed)
PROGRESS NOTE  Isla Pencentwon Siers ZOX:[id number] DOB: 03/24/1986 DOA: 10/13/2018 PCP: Patient, No Pcp Per   LOS: 1 day   Brief narrative: Patient is a 33 y.o. male with a history of asthma, tobacco use, marijuana use the ED with chest pain, subsequently found to have non-STEMI.   Patient underwent LHC today.  Subjective: Patient was seen and examined this morning.  Young male.  Lying in bed. Underwent cardiac cath earlier.  Not in distress.  Assessment/Plan:  Principal Problem:   Non-ST elevation (NSTEMI) myocardial infarction [facility](HCC) Active Problems:   Asthma   Hyperlipidemia   Chest pain   Non-STEMI (non-ST elevated myocardial infarction) (HCC)   Angina at rest [facility](HCC)  Non-STEMI:  Underwent cardiac catheterization this morning.  Found to have 95% stenosis of the proximal LAD.  Per cardiology note, patient most likely had type I MI related to coronary artery dissection and subsequent thrombus.  Postprocedure, patient has been started on tirofiban drip.  Noted a plan to continue it for 36 hours and repeat coronary angiogram.   Continue aspirin/Plavix/statin/beta-blocker.  Asthma: Stable without any signs of exacerbation.  Continue with as needed bronchodilators.  Tobacco abuse: We will need counseling prior to discharge  COVID 19 screen: Negative.  Body mass index is 27.64 kg/m. Mobility: Encourage ambulation Diet: Cardiac diet  DVT prophylaxis:  Currently on tirofiban drip Code Status:   Code Status: Full Code  Disposition Plan:  Pending cardiology clearance  Consultants:  Cardiology  Procedures:  Cardiac cath today.  See above.  Antimicrobials:  Anti-infectives (From admission, onward)   None      Infusions:  . sodium chloride    . tirofiban 0.15 mcg/kg/min (10/16/18 1419)    Scheduled Meds: . aspirin EC  81 mg Oral Daily  . atorvastatin  80 mg Oral q1800  . [START ON 10/17/2018] clopidogrel  75 mg Oral Q breakfast  . metoprolol tartrate  12.5 mg Oral BID   . pantoprazole  40 mg Oral Daily  . sodium chloride flush  3 mL Intravenous Q12H  . sodium chloride flush  3 mL Intravenous Q12H    PRN meds: sodium chloride, acetaminophen, albuterol, hydrALAZINE, labetalol, morphine injection, ondansetron (ZOFRAN) IV, sodium chloride flush   Objective: Vitals:   10/16/18 1108 10/16/18 1220  BP: 139/87 (!) 135/92  Pulse: 65 (!) 57  Resp: 14 (!) 23  Temp:    SpO2: (!) 0% 98%    Intake/Output Summary (Last 24 hours) at 10/16/2018 1652 Last data filed at 10/16/2018 1030 Gross per 24 hour  Intake 1238.23 ml  Output 1 ml  Net 1237.23 ml   Filed Weights   10/13/18 1652 10/14/18 0128 10/16/18 0404  Weight: 84.8 kg 84.1 kg 82.5 kg   Weight change:  Body mass index is 27.64 kg/m.   Physical Exam: General exam: Appears calm and comfortable.  Skin: No rashes, lesions or ulcers. HEENT: Atraumatic, normocephalic, supple neck, no obvious bleeding Lungs: Clear to auscultation bilaterally CVS: Regular rate and rhythm, no murmur GI/Abd soft, nontender, nondistended, bowel sound present CNS: Alert, awake oriented x3 Psychiatry: Mood & affect appropriate.  Extremities: No pedal edema, no calf tenderness   Data Review: I have personally reviewed the laboratory data and studies available.  Recent Labs  Lab 10/13/18 1657 10/14/18 0205 10/16/18 0256  WBC 5.4 9.9 7.3  HGB 14.2 14.8 14.7  HCT 45.4 45.5 45.5  MCV 91.9 89.9 89.7  PLT 440* 412* 415*   Recent Labs  Lab 10/13/18 1657 10/14/18 0205 10/16/18  0256  NA 139 136 136  K 3.9 3.9 3.5  CL 105 101 100  CO2 26 23 23   GLUCOSE 122* 108* 98  BUN 13 8 12   CREATININE 1.13 1.06 1.28*  CALCIUM 9.5 9.5 9.5    Lorin Glass, MD  [facility] 10/16/2018

## 2018-10-16 NOTE — H&P (View-Only) (Signed)
The patient has been seen in conjunction with Laverda PageLindsay Roberts, NP. All aspects of care have been considered and discussed. The patient has been personally interviewed, examined, and all clinical data has been reviewed.   After review likely type I myocardial infarction related to coronary artery dissection.  Rule out Takosubo and premature coronary atherosclerosis with plaque rupture.  Slightly elevated platelet count raises possibility of thrombophilia although this does not usually occur until platelet count is greater than 1 million.  Coronary angiography including risks were discussed with the patient and accepted.  Described risks include stroke, death, myocardial infarction, bleeding, kidney injury, among others.  Progress Note  Patient Name: Alex Ortiz Asay Date of Encounter: 10/16/2018  Primary Cardiologist: Chrystie NoseKenneth C Hilty, MD   Subjective   Presented with prolonged chest pain and according to the patient was ignored in the emergency room despite severe ongoing chest pain.  He is still quite aggravated by initial abdominal fullness and suspicion concerning his presentation.  He gets aggravated and very emotional when he describes the treatment.  Not currently having chest pain.  Total duration of chest pain 12 hours.  Inpatient Medications    Scheduled Meds: . aspirin EC  81 mg Oral Daily  . atorvastatin  40 mg Oral q1800  . metoprolol tartrate  12.5 mg Oral BID  . pantoprazole  40 mg Oral Daily  . sodium chloride flush  3 mL Intravenous Q12H   Continuous Infusions: . sodium chloride    . sodium chloride 1 mL/kg/hr (10/16/18 0458)  . heparin 1,250 Units/hr (10/16/18 0430)   PRN Meds: sodium chloride, acetaminophen, albuterol, ondansetron (ZOFRAN) IV, sodium chloride flush   Vital Signs    Vitals:   10/14/18 2053 10/15/18 2046 10/16/18 0402 10/16/18 0404  BP: 124/80 127/80 117/73   Pulse: 72 76 66   Resp:  20 14   Temp: 98.8 F (37.1 C) 99 F (37.2 C) 98.1 F (36.7  C)   TempSrc: Oral Oral Oral   SpO2: 99% 96% 98%   Weight:    82.5 kg  Height:        Intake/Output Summary (Last 24 hours) at 10/16/2018 0756 Last data filed at 10/16/2018 0458 Gross per 24 hour  Intake 943.49 ml  Output -  Net 943.49 ml   Last 3 Weights 10/16/2018 10/14/2018 10/13/2018  Weight (lbs) 181 lb 12.8 oz 185 lb 6.4 oz 187 lb  Weight (kg) 82.464 kg 84.097 kg 84.823 kg      Telemetry    nsr - Personally Reviewed  ECG    Anterolateral T wave changes I, aVL, V1 through V3.- Personally Reviewed  Physical Exam  Healthy-appearing young African-American male. GEN: No acute distress.   Neck: No JVD Cardiac:  No auscultation performed.  2+ bilateral radial Respiratory: Clear to auscultation bilaterally. GI: Soft, nontender, non-distended  MS: No edema; No deformity. Neuro:  Nonfocal  Psych: Normal affect   Labs    Chemistry Recent Labs  Lab 10/13/18 1657 10/14/18 0205 10/16/18 0256  NA 139 136 136  K 3.9 3.9 3.5  CL 105 101 100  CO2 26 23 23   GLUCOSE 122* 108* 98  BUN 13 8 12   CREATININE 1.13 1.06 1.28*  CALCIUM 9.5 9.5 9.5  PROT  --   --  7.1  ALBUMIN  --   --  3.9  AST  --   --  58*  ALT  --   --  33  ALKPHOS  --   --  52  BILITOT  --   --  1.6*  GFRNONAA >60 >60 >60  GFRAA >60 >60 >60  ANIONGAP 8 12 13      Hematology Recent Labs  Lab 10/13/18 1657 10/14/18 0205 10/16/18 0256  WBC 5.4 9.9 7.3  RBC 4.94 5.06 5.07  HGB 14.2 14.8 14.7  HCT 45.4 45.5 45.5  MCV 91.9 89.9 89.7  MCH 28.7 29.2 29.0  MCHC 31.3 32.5 32.3  RDW 12.4 12.3 12.3  PLT 440* 412* 415*    Cardiac Enzymes Recent Labs  Lab 10/13/18 2007 10/13/18 2300 10/15/18 0445 10/15/18 0728  TROPONINI 0.09* 0.31* 23.91* 19.30*   No results for input(s): TROPIPOC in the last 168 hours.   BNPNo results for input(s): BNP, PROBNP in the last 168 hours.   DDimer No results for input(s): DDIMER in the last 168 hours.   Radiology    No results found.  Cardiac Studies    10/14/18  IMPRESSIONS   1. The left ventricle has mild-moderately reduced systolic function, with an ejection fraction of 40-45%. The cavity size was normal. There is akinesis of the apical septal, inferior, anterior, lateral, inferolateral and apical segments. Left  ventricular diastolic parameters were normal.  2. The right ventricle has normal systolic function. The cavity was normal. There is no increase in right ventricular wall thickness.  3. The aortic valve is grossly normal.  Patient Profile     33 y.o. male with PMH of tobacco/marijuana use and asthma who presented with chest pain and NSTEMI.   Assessment & Plan    1. NSTEMI: troponin peaked at 23.91. EKG with evolving changes and TWI in anterolateral leads. Remains on IV heparin. Ideally would need a cardiac cath but did not sign consent yesterday. Will inform MD to follow up today with patient regarding plan.  -- on ASA, statin, and BB  2. HL: LDL 127. On statin therapy  3. Acute systolic HF: EF noted at 40-45% on echo. Now on BB therapy. Consider adding ARB post cath pending renal function.  4. Tobacco use: cessation counseling per nursing.   For questions or updates, please contact [facility] Please consult [email or web address] for contact info under        Signed, [name], NP  10/16/2018, 7:56 AM

## 2018-10-16 NOTE — Interval H&P Note (Signed)
Cath Lab Visit (complete for each Cath Lab visit)  Clinical Evaluation Leading to the Procedure:   ACS: Yes.    Non-ACS:    Anginal Classification: CCS III  Anti-ischemic medical therapy: No Therapy  Non-Invasive Test Results: No non-invasive testing performed  Prior CABG: No previous CABG      History and Physical Interval Note:  10/16/2018 10:26 AM  Alex Ortiz  has presented today for surgery, with the diagnosis of NSTEMI.  The various methods of treatment have been discussed with the patient and family. After consideration of risks, benefits and other options for treatment, the patient has consented to  Procedure(s): LEFT HEART CATH AND CORONARY ANGIOGRAPHY (N/A) as a surgical intervention.  The patient's history has been reviewed, patient examined, no change in status, stable for surgery.  I have reviewed the patient's chart and labs.  Questions were answered to the patient's satisfaction.     [name]

## 2018-10-17 DIAGNOSIS — I10 Essential (primary) hypertension: Secondary | ICD-10-CM

## 2018-10-17 LAB — BASIC METABOLIC PANEL
Anion gap: 10 (ref 5–15)
BUN: 9 mg/dL (ref 6–20)
CO2: 22 mmol/L (ref 22–32)
Calcium: 9 mg/dL (ref 8.9–10.3)
Chloride: 103 mmol/L (ref 98–111)
Creatinine, Ser: 1.18 mg/dL (ref 0.61–1.24)
GFR calc Af Amer: >60 mL/min (ref >60)
GFR calc non Af Amer: >60 mL/min (ref >60)
Glucose, Bld: 102 mg/dL — ABNORMAL HIGH (ref 70–99)
Potassium: 3.6 mmol/L (ref 3.5–5.1)
Sodium: 135 mmol/L (ref 135–145)

## 2018-10-17 LAB — CBC
HCT: 42.2 % (ref 39.0–52.0)
Hemoglobin: 13.6 g/dL (ref 13.0–17.0)
MCH: 28.9 pg (ref 26.0–34.0)
MCHC: 32.2 g/dL (ref 30.0–36.0)
MCV: 89.8 fL (ref 80.0–100.0)
Platelets: 388 10*3/uL (ref 150–400)
RBC: 4.7 MIL/uL (ref 4.22–5.81)
RDW: 12.1 % (ref 11.5–15.5)
WBC: 5.7 10*3/uL (ref 4.0–10.5)
nRBC: 0 % (ref 0.0–0.2)

## 2018-10-17 MED ORDER — SODIUM CHLORIDE 0.9 % WEIGHT BASED INFUSION: 1.0000 mL/kg/h | INTRAVENOUS | Status: DC

## 2018-10-17 MED ORDER — SODIUM CHLORIDE 0.9 % WEIGHT BASED INFUSION
3.0000 mL/kg/h | INTRAVENOUS | Status: AC
  Administered 2018-10-18: 04:00:00 3 mL/kg/h via INTRAVENOUS

## 2018-10-17 MED FILL — Nitroglycerin IV Soln 100 MCG/ML in D5W: INTRA_ARTERIAL | Qty: 10 | Status: AC

## 2018-10-17 NOTE — Progress Notes (Signed)
PROGRESS NOTE  Alex Ortiz [id number]:[id number] DOB: Oct 12, 1985 DOA: 10/13/2018 PCP: Patient, No Pcp Per   LOS: 2 days   Brief narrative: Patient is a32 y.o.malewith a history of asthma, tobacco use, marijuana use the ED with chest pain, subsequently found to have non-STEMI.  Patient underwent LHC on 5/18. See below for details  Subjective: Patient was seen and examined this afternoon.  Sitting up in bed.  Not in distress.  No new symptoms.  Remains on tirofiban drip  Assessment/Plan:  Principal Problem:   Non-ST elevation (NSTEMI) myocardial infarction [facility]) Active Problems:   Asthma   Hyperlipidemia   Chest pain   Non-STEMI (non-ST elevated myocardial infarction) (HCC)   Angina at rest [facility])  Non-STEMI: Underwent cardiac catheterization on 5/18. Found to have 95% stenosis of the proximal LAD.  Per cardiology note, patient most likely had type I MI related to coronary artery dissection and subsequent thrombus.  Postprocedure, patient has been started on tirofiban drip. Noted a plan to continue it for 36 hours and repeat coronary angiogram tomorrow morning. Continue aspirin/Plavix/statin/beta-blocker.  Asthma:Stable without any signs of exacerbation. Continue with as needed bronchodilators.  Tobacco abuse: will need counseling prior to discharge  COVID 19 screen:Negative.  Mobility: Encourage ambulation Diet: Cardiac diet  DVT prophylaxis: Currently on tirofiban drip Code Status:  Code Status: Full Code  Disposition Plan: Pending cardiology clearance  Consultants:  Cardiology  Procedures:  Cardiac cath today.  See above.  Antimicrobials:  Anti-infectives (From admission, onward)   None      Infusions:  . sodium chloride    . [START ON 10/18/2018] sodium chloride     Followed by  . [START ON 10/18/2018] sodium chloride    . tirofiban 0.15 mcg/kg/min (10/17/18 1200)    Scheduled Meds: . aspirin EC  81 mg Oral Daily  . atorvastatin  80 mg Oral  q1800  . clopidogrel  75 mg Oral Q breakfast  . metoprolol tartrate  12.5 mg Oral BID  . pantoprazole  40 mg Oral Daily  . sodium chloride flush  3 mL Intravenous Q12H  . sodium chloride flush  3 mL Intravenous Q12H    PRN meds: sodium chloride, acetaminophen, albuterol, morphine injection, ondansetron (ZOFRAN) IV, sodium chloride flush   Objective: Vitals:   10/17/18 1153 10/17/18 1627  BP: 136/90 125/78  Pulse: (!) 59 93  Resp: 12 20  Temp: 98.5 F (36.9 C) 98.7 F (37.1 C)  SpO2:      Intake/Output Summary (Last 24 hours) at 10/17/2018 1704 Last data filed at 10/17/2018 0411 Gross per 24 hour  Intake 90.25 ml  Output 400 ml  Net -309.75 ml   Filed Weights   10/13/18 1652 10/14/18 0128 10/16/18 0404  Weight: 84.8 kg 84.1 kg 82.5 kg   Weight change:  Body mass index is 27.64 kg/m.   Physical Exam: General exam: Appears calm and comfortable.  Skin: No rashes, lesions or ulcers. HEENT: Atraumatic, normocephalic, supple neck, no obvious bleeding Lungs: Clear to auscultation bilaterally CVS: Regular rate and rhythm, no murmur GI/Abd soft, nondistended, nontender, bowel sound present CNS: Alert, awake, oriented x3 Psychiatry: Mood appropriate Extremities: No pedal edema, no calf tenderness  Data Review: I have personally reviewed the laboratory data and studies available.  Recent Labs  Lab 10/13/18 1657 10/14/18 0205 10/16/18 0256 10/16/18 1932 10/17/18 0249  WBC 5.4 9.9 7.3 6.7 5.7  HGB 14.2 14.8 14.7 14.4 13.6  HCT 45.4 45.5 45.5 44.4 42.2  MCV 91.9 89.9 89.7 89.5 89.8  PLT 440* 412* 415* 436* 388   Recent Labs  Lab 10/13/18 1657 10/14/18 0205 10/16/18 0256 10/17/18 0249  NA 139 136 136 135  K 3.9 3.9 3.5 3.6  CL 105 101 100 103  CO2 26 23 23 22   GLUCOSE 122* 108* 98 102*  BUN 13 8 12 9   CREATININE 1.13 1.06 1.28* 1.18  CALCIUM 9.5 9.5 9.5 9.0    Alex GlassBinaya Jaris Kohles, MD  [facility] 10/17/2018

## 2018-10-17 NOTE — Progress Notes (Signed)
 The patient has been seen in conjunction with [name], NP. All aspects of care have been considered and discussed. The patient has been personally interviewed, examined, and all clinical data has been reviewed.   Suspected coronary dissection type 3.  Plan is for repeat angiography in a.m. and if showing evidence of improving degree of obstruction, continue conservative approach.  If still has high-grade obstruction, may require stenting.  Discussed with the patient and his father.  For the time being, continue Aggrastat, Plavix, and preparation for cath in a.m.  We will further increase beta-blocker therapy to cover blood pressure with diastolics have been running greater than 80.   Procedure and risks were discussed.  Father was also present during the discussion.  Risk of stroke, death, myocardial infarction, limb ischemia, bleeding, kidney injury, among others were discussed in detail.      Progress Note  Patient Name: Alex Ortiz Date of Encounter: 10/17/2018  Primary Cardiologist: Kenneth C Hilty, MD   Subjective   No recurrence of chest pain since admission  Inpatient Medications    Scheduled Meds: . aspirin EC  81 mg Oral Daily  . atorvastatin  80 mg Oral q1800  . clopidogrel  75 mg Oral Q breakfast  . metoprolol tartrate  12.5 mg Oral BID  . pantoprazole  40 mg Oral Daily  . sodium chloride flush  3 mL Intravenous Q12H  . sodium chloride flush  3 mL Intravenous Q12H   Continuous Infusions: . sodium chloride    . tirofiban 0.15 mcg/kg/min (10/17/18 0357)   PRN Meds: sodium chloride, acetaminophen, albuterol, morphine injection, ondansetron (ZOFRAN) IV, sodium chloride flush   Vital Signs    Vitals:   10/16/18 2038 10/16/18 2109 10/16/18 2336 10/17/18 0410  BP: (!) 129/29 132/81 130/82 110/76  Pulse: 60 74 61 65  Resp: 11  16 (!) 30  Temp: 98.6 F (37 C)  98.7 F (37.1 C) 98.7 F (37.1 C)  TempSrc: Oral  Oral Oral  SpO2:   97% 99%  Weight:       Height:        Intake/Output Summary (Last 24 hours) at 10/17/2018 1059 Last data filed at 10/17/2018 0411 Gross per 24 hour  Intake 90.25 ml  Output 400 ml  Net -309.75 ml   Last 3 Weights 10/16/2018 10/14/2018 10/13/2018  Weight (lbs) 181 lb 12.8 oz 185 lb 6.4 oz 187 lb  Weight (kg) 82.464 kg 84.097 kg 84.823 kg      Telemetry    Sinus rhythm without ectopy- Personally Reviewed  ECG    A new tracing is not been performed- Personally Reviewed  Physical Exam  In good condition this morning.  No appearance of discomfort GEN: No acute distress.   Neck: No JVD Cardiac: RRR, no murmurs, rubs, or gallops.  Respiratory: Clear to auscultation bilaterally. GI: Soft, nontender, non-distended  MS: No edema; No deformity. Neuro:  Nonfocal  Psych: Normal affect   Labs    Chemistry Recent Labs  Lab 10/14/18 0205 10/16/18 0256 10/17/18 0249  NA 136 136 135  K 3.9 3.5 3.6  CL 101 100 103  CO2 23 23 22  GLUCOSE 108* 98 102*  BUN 8 12 9  CREATININE 1.06 1.28* 1.18  CALCIUM 9.5 9.5 9.0  PROT  --  7.1  --   ALBUMIN  --  3.9  --   AST  --  58*  --   ALT  --  33  --   ALKPHOS  --    52  --   BILITOT  --  1.6*  --   GFRNONAA >60 >60 >60  GFRAA >60 >60 >60  ANIONGAP 12 13 10      Hematology Recent Labs  Lab 10/16/18 0256 10/16/18 1932 10/17/18 0249  WBC 7.3 6.7 5.7  RBC 5.07 4.96 4.70  HGB 14.7 14.4 13.6  HCT 45.5 44.4 42.2  MCV 89.7 89.5 89.8  MCH 29.0 29.0 28.9  MCHC 32.3 32.4 32.2  RDW 12.3 12.1 12.1  PLT 415* 436* 388    Cardiac Enzymes Recent Labs  Lab 10/13/18 2007 10/13/18 2300 10/15/18 0445 10/15/18 0728  TROPONINI 0.09* 0.31* 23.91* 19.30*   No results for input(s): TROPIPOC in the last 168 hours.   BNPNo results for input(s): BNP, PROBNP in the last 168 hours.   DDimer No results for input(s): DDIMER in the last 168 hours.   Radiology    No results found.  Cardiac Studies   Cath: 10/16/18  IMPRESSION: [name] has a high-grade proximal  LAD stenosis with associated thrombus and some hypodensity in tapered appearing vessel beginning at the origin of the LAD on the left main raising the suspicion that this is coronary dissection (S CAD) with associated thrombus.  He has no other atherosclerosis or risk factors.  His EF has improved by left-ventricular agree.  I reviewed his angiograms with Drs. Eldridge Dace and Katrinka Blazing and the consensus is that we will put him on Aggrastat for 36 hours and then relook at his coronary circulation.  I am also be going to begin him on Plavix.  We will transfer him from 6 E. to see for closer observation.  The sheath was removed and a TR band was placed on the right wrist to achieve patent hemostasis.  The patient left lab in stable condition.  [name]. MD, [facility]  Diagnostic  Dominance: Right    TTE: 10/14/18  IMPRESSIONS    1. The left ventricle has mild-moderately reduced systolic function, with an ejection fraction of 40-45%. The cavity size was normal. There is akinesis of the apical septal, inferior, anterior, lateral, inferolateral and apical segments. Left  ventricular diastolic parameters were normal.  2. The right ventricle has normal systolic function. The cavity was normal. There is no increase in right ventricular wall thickness.  3. The aortic valve is grossly normal.   Patient Profile     33 y.o. 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  Repeat angiography scheduled for 10/17/2018.  Assessment & Plan    1. NSTEMI: troponin peaked at 23.91. EKG with evolving changes and TWI in anterolateral leads. Underwent cardiac cath yesterday noted above with Dr. Allyson Sabal, question of SCAD vs plaque rupture in the pLAD. Placed on aggrastat for 36 hours with relook cath in the am. Plavix started yesterday. -- on ASA, statin, plavix and BB  2. HL: LDL 127. On statin therapy   3. Acute systolic HF: EF noted at 40-45% on echo. Now on BB therapy. Consider adding ARB.  4. Tobacco use: cessation counseling per nursing.  For questions or updates, please contact [facility] Please consult [email or web address] for contact info under    Signed, [name], NP  10/17/2018, 10:59 AM

## 2018-10-17 NOTE — H&P (View-Only) (Signed)
The patient has been seen in conjunction with Alex PageLindsay Roberts, NP. All aspects of care have been considered and discussed. The patient has been personally interviewed, examined, and all clinical data has been reviewed.   Suspected coronary dissection type 3.  Plan is for repeat angiography in a.m. and if showing evidence of improving degree of obstruction, continue conservative approach.  If still has high-grade obstruction, may require stenting.  Discussed with the patient and his father.  For the time being, continue Aggrastat, Plavix, and preparation for cath in a.m.  We will further increase beta-blocker therapy to cover blood pressure with diastolics have been running greater than 80.   Procedure and risks were discussed.  Father was also present during the discussion.  Risk of stroke, death, myocardial infarction, limb ischemia, bleeding, kidney injury, among others were discussed in detail.      Progress Note  Patient Name: Alex Ortiz Date of Encounter: 10/17/2018  Primary Cardiologist: Alex NoseKenneth C Hilty, MD   Subjective   No recurrence of chest pain since admission  Inpatient Medications    Scheduled Meds: . aspirin EC  81 mg Oral Daily  . atorvastatin  80 mg Oral q1800  . clopidogrel  75 mg Oral Q breakfast  . metoprolol tartrate  12.5 mg Oral BID  . pantoprazole  40 mg Oral Daily  . sodium chloride flush  3 mL Intravenous Q12H  . sodium chloride flush  3 mL Intravenous Q12H   Continuous Infusions: . sodium chloride    . tirofiban 0.15 mcg/kg/min (10/17/18 0357)   PRN Meds: sodium chloride, acetaminophen, albuterol, morphine injection, ondansetron (ZOFRAN) IV, sodium chloride flush   Vital Signs    Vitals:   10/16/18 2038 10/16/18 2109 10/16/18 2336 10/17/18 0410  BP: (!) 129/29 132/81 130/82 110/76  Pulse: 60 74 61 65  Resp: 11  16 (!) 30  Temp: 98.6 F (37 C)  98.7 F (37.1 C) 98.7 F (37.1 C)  TempSrc: Oral  Oral Oral  SpO2:   97% 99%  Weight:       Height:        Intake/Output Summary (Last 24 hours) at 10/17/2018 1059 Last data filed at 10/17/2018 0411 Gross per 24 hour  Intake 90.25 ml  Output 400 ml  Net -309.75 ml   Last 3 Weights 10/16/2018 10/14/2018 10/13/2018  Weight (lbs) 181 lb 12.8 oz 185 lb 6.4 oz 187 lb  Weight (kg) 82.464 kg 84.097 kg 84.823 kg      Telemetry    Sinus rhythm without ectopy- Personally Reviewed  ECG    A new tracing is not been performed- Personally Reviewed  Physical Exam  In good condition this morning.  No appearance of discomfort GEN: No acute distress.   Neck: No JVD Cardiac: RRR, no murmurs, rubs, or gallops.  Respiratory: Clear to auscultation bilaterally. GI: Soft, nontender, non-distended  MS: No edema; No deformity. Neuro:  Nonfocal  Psych: Normal affect   Labs    Chemistry Recent Labs  Lab 10/14/18 0205 10/16/18 0256 10/17/18 0249  NA 136 136 135  K 3.9 3.5 3.6  CL 101 100 103  CO2 23 23 22   GLUCOSE 108* 98 102*  BUN 8 12 9   CREATININE 1.06 1.28* 1.18  CALCIUM 9.5 9.5 9.0  PROT  --  7.1  --   ALBUMIN  --  3.9  --   AST  --  58*  --   ALT  --  33  --   ALKPHOS  --  52  --   BILITOT  --  1.6*  --   GFRNONAA >60 >60 >60  GFRAA >60 >60 >60  ANIONGAP 12 13 10      Hematology Recent Labs  Lab 10/16/18 0256 10/16/18 1932 10/17/18 0249  WBC 7.3 6.7 5.7  RBC 5.07 4.96 4.70  HGB 14.7 14.4 13.6  HCT 45.5 44.4 42.2  MCV 89.7 89.5 89.8  MCH 29.0 29.0 28.9  MCHC 32.3 32.4 32.2  RDW 12.3 12.1 12.1  PLT 415* 436* 388    Cardiac Enzymes Recent Labs  Lab 10/13/18 2007 10/13/18 2300 10/15/18 0445 10/15/18 0728  TROPONINI 0.09* 0.31* 23.91* 19.30*   No results for input(s): TROPIPOC in the last 168 hours.   BNPNo results for input(s): BNP, PROBNP in the last 168 hours.   DDimer No results for input(s): DDIMER in the last 168 hours.   Radiology    No results found.  Cardiac Studies   Cath: 10/16/18  IMPRESSION: [name] has a high-grade proximal  LAD stenosis with associated thrombus and some hypodensity in tapered appearing vessel beginning at the origin of the LAD on the left main raising the suspicion that this is coronary dissection (S CAD) with associated thrombus.  He has no other atherosclerosis or risk factors.  His EF has improved by left-ventricular agree.  I reviewed his angiograms with Drs. Eldridge Dace and Katrinka Blazing and the consensus is that we will put him on Aggrastat for 36 hours and then relook at his coronary circulation.  I am also be going to begin him on Plavix.  We will transfer him from 6 E. to see for closer observation.  The sheath was removed and a TR band was placed on the right wrist to achieve patent hemostasis.  The patient left lab in stable condition.  [name]. MD, [facility]  Diagnostic  Dominance: Right    TTE: 10/14/18  IMPRESSIONS    1. The left ventricle has mild-moderately reduced systolic function, with an ejection fraction of 40-45%. The cavity size was normal. There is akinesis of the apical septal, inferior, anterior, lateral, inferolateral and apical segments. Left  ventricular diastolic parameters were normal.  2. The right ventricle has normal systolic function. The cavity was normal. There is no increase in right ventricular wall thickness.  3. The aortic valve is grossly normal.   Patient Profile     33 y.o. 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  Repeat angiography scheduled for 10/17/2018.  Assessment & Plan    1. NSTEMI: troponin peaked at 23.91. EKG with evolving changes and TWI in anterolateral leads. Underwent cardiac cath yesterday noted above with Dr. Allyson Sabal, question of SCAD vs plaque rupture in the pLAD. Placed on aggrastat for 36 hours with relook cath in the am. Plavix started yesterday. -- on ASA, statin, plavix and BB  2. HL: LDL 127. On statin therapy   3. Acute systolic HF: EF noted at 40-45% on echo. Now on BB therapy. Consider adding ARB.  4. Tobacco use: cessation counseling per nursing.  For questions or updates, please contact [facility] Please consult [email or web address] for contact info under    Signed, Alex Page, NP  10/17/2018, 10:59 AM

## 2018-10-18 ENCOUNTER — Encounter (HOSPITAL_COMMUNITY): Payer: Self-pay | Admitting: Cardiology

## 2018-10-18 ENCOUNTER — Encounter (HOSPITAL_COMMUNITY)
Admission: EM | Disposition: A | Discharge status: Home / Self Care | Payer: Self-pay | Source: Home / Self Care | Attending: Cardiovascular Disease

## 2018-10-18 DIAGNOSIS — I208 Other forms of angina pectoris: Secondary | ICD-10-CM

## 2018-10-18 DIAGNOSIS — I25110 Atherosclerotic heart disease of native coronary artery with unstable angina pectoris: Secondary | ICD-10-CM | POA: Diagnosis present

## 2018-10-18 HISTORY — PX: CORONARY STENT INTERVENTION: CATH118234

## 2018-10-18 LAB — BASIC METABOLIC PANEL
Anion gap: 11 (ref 5–15)
BUN: 10 mg/dL (ref 6–20)
CO2: 24 mmol/L (ref 22–32)
Calcium: 9.3 mg/dL (ref 8.9–10.3)
Chloride: 103 mmol/L (ref 98–111)
Creatinine, Ser: 1.12 mg/dL (ref 0.61–1.24)
GFR calc Af Amer: >60 mL/min (ref >60)
GFR calc non Af Amer: >60 mL/min (ref >60)
Glucose, Bld: 100 mg/dL — ABNORMAL HIGH (ref 70–99)
Potassium: 3.5 mmol/L (ref 3.5–5.1)
Sodium: 138 mmol/L (ref 135–145)

## 2018-10-18 LAB — CBC
HCT: 41.9 % (ref 39.0–52.0)
Hemoglobin: 13.5 g/dL (ref 13.0–17.0)
MCH: 29.1 pg (ref 26.0–34.0)
MCHC: 32.2 g/dL (ref 30.0–36.0)
MCV: 90.3 fL (ref 80.0–100.0)
Platelets: 393 10*3/uL (ref 150–400)
RBC: 4.64 MIL/uL (ref 4.22–5.81)
RDW: 12.1 % (ref 11.5–15.5)
WBC: 5.5 10*3/uL (ref 4.0–10.5)
nRBC: 0 % (ref 0.0–0.2)

## 2018-10-18 SURGERY — CORONARY STENT INTERVENTION
Anesthesia: LOCAL

## 2018-10-18 MED ORDER — FENTANYL CITRATE (PF) 100 MCG/2ML IJ SOLN
INTRAMUSCULAR | Status: AC
  Filled 2018-10-18: qty 2

## 2018-10-18 MED ORDER — MIDAZOLAM HCL 2 MG/2ML IJ SOLN
INTRAMUSCULAR | Status: DC | PRN
  Administered 2018-10-18: 1 mg via INTRAVENOUS

## 2018-10-18 MED ORDER — FENTANYL CITRATE (PF) 100 MCG/2ML IJ SOLN
INTRAMUSCULAR | Status: DC | PRN
  Administered 2018-10-18 (×2): 25 ug via INTRAVENOUS

## 2018-10-18 MED ORDER — HEPARIN (PORCINE) IN NACL 1000-0.9 UT/500ML-% IV SOLN
INTRAVENOUS | Status: AC
  Filled 2018-10-18: qty 1000

## 2018-10-18 MED ORDER — HEPARIN (PORCINE) IN NACL 1000-0.9 UT/500ML-% IV SOLN
INTRAVENOUS | Status: DC | PRN
  Administered 2018-10-18 (×2): 500 mL

## 2018-10-18 MED ORDER — LABETALOL HCL 5 MG/ML IV SOLN: 10.0000 mg | INTRAVENOUS | Status: AC | PRN

## 2018-10-18 MED ORDER — IOHEXOL 350 MG/ML SOLN
INTRAVENOUS | Status: DC | PRN
  Administered 2018-10-18: 10:00:00 85 mL via INTRA_ARTERIAL

## 2018-10-18 MED ORDER — LIDOCAINE HCL (PF) 1 % IJ SOLN
INTRAMUSCULAR | Status: DC | PRN
  Administered 2018-10-18: 3 mL via INTRADERMAL

## 2018-10-18 MED ORDER — VERAPAMIL HCL 2.5 MG/ML IV SOLN
INTRAVENOUS | Status: AC
  Filled 2018-10-18: qty 2

## 2018-10-18 MED ORDER — MIDAZOLAM HCL 2 MG/2ML IJ SOLN
INTRAMUSCULAR | Status: AC
  Filled 2018-10-18: qty 2

## 2018-10-18 MED ORDER — NITROGLYCERIN 0.4 MG SL SUBL
SUBLINGUAL_TABLET | SUBLINGUAL | Status: AC
  Filled 2018-10-18: qty 1

## 2018-10-18 MED ORDER — NITROGLYCERIN 1 MG/10 ML FOR IR/CATH LAB
INTRA_ARTERIAL | Status: AC
  Filled 2018-10-18: qty 10

## 2018-10-18 MED ORDER — SODIUM CHLORIDE 0.9 % IV SOLN
INTRAVENOUS | Status: AC
  Administered 2018-10-18: 12:00:00 via INTRAVENOUS

## 2018-10-18 MED ORDER — SODIUM CHLORIDE 0.9% FLUSH
3.0000 mL | Freq: Two times a day (BID) | INTRAVENOUS | Status: DC
  Administered 2018-10-18: 21:00:00 3 mL via INTRAVENOUS

## 2018-10-18 MED ORDER — NITROGLYCERIN 0.4 MG SL SUBL
SUBLINGUAL_TABLET | SUBLINGUAL | Status: DC | PRN
  Administered 2018-10-18: .4 mg via SUBLINGUAL

## 2018-10-18 MED ORDER — SODIUM CHLORIDE 0.9% FLUSH: 3.0000 mL | INTRAVENOUS | Status: DC | PRN

## 2018-10-18 MED ORDER — HYDRALAZINE HCL 20 MG/ML IJ SOLN: 10.0000 mg | INTRAMUSCULAR | Status: AC | PRN

## 2018-10-18 MED ORDER — VERAPAMIL HCL 2.5 MG/ML IV SOLN
INTRAVENOUS | Status: DC | PRN
  Administered 2018-10-18: 10 mL via INTRA_ARTERIAL

## 2018-10-18 MED ORDER — LIDOCAINE HCL (PF) 1 % IJ SOLN
INTRAMUSCULAR | Status: AC
  Filled 2018-10-18: qty 30

## 2018-10-18 MED ORDER — SODIUM CHLORIDE 0.9 % IV SOLN: 250.0000 mL | INTRAVENOUS | Status: DC | PRN

## 2018-10-18 MED ORDER — HEPARIN SODIUM (PORCINE) 1000 UNIT/ML IJ SOLN
INTRAMUSCULAR | Status: DC | PRN
  Administered 2018-10-18 (×2): 4000 [IU] via INTRAVENOUS

## 2018-10-18 SURGICAL SUPPLY — 18 items
BALLN SAPPHIRE 2.5X15 (BALLOONS) ×2
BALLN SAPPHIRE ~~LOC~~ 4.0X12 (BALLOONS) ×2 IMPLANT
BALLOON SAPPHIRE 2.5X15 (BALLOONS) ×1 IMPLANT
CATH INFINITI 5 FR JL3.5 (CATHETERS) ×2 IMPLANT
CATH VISTA GUIDE 6FR XBLAD3.5 (CATHETERS) ×2 IMPLANT
DEVICE RAD COMP TR BAND LRG (VASCULAR PRODUCTS) ×2 IMPLANT
ELECT DEFIB PAD ADLT CADENCE (PAD) ×2 IMPLANT
GLIDESHEATH SLEND SS 6F .021 (SHEATH) ×2 IMPLANT
GUIDEWIRE INQWIRE 1.5J.035X260 (WIRE) ×1 IMPLANT
INQWIRE 1.5J .035X260CM (WIRE) ×2
KIT ENCORE 26 ADVANTAGE (KITS) ×2 IMPLANT
KIT HEART LEFT (KITS) ×2 IMPLANT
PACK CARDIAC CATHETERIZATION (CUSTOM PROCEDURE TRAY) ×2 IMPLANT
SHEATH PROBE COVER 6X72 (BAG) ×2 IMPLANT
STENT RESOLUTE ONYX 3.5X18 (Permanent Stent) ×2 IMPLANT
TRANSDUCER W/STOPCOCK (MISCELLANEOUS) ×2 IMPLANT
TUBING CIL FLEX 10 FLL-RA (TUBING) ×2 IMPLANT
WIRE HI TORQ BMW 190CM (WIRE) ×2 IMPLANT

## 2018-10-18 NOTE — Progress Notes (Signed)
   Had LAD stent.Still has ostial to proximal LAD narrowing, ? athero- vs hematoma  Plan DC tomorrow if stable.   Pain post procedure not associated with acute ECG changes.

## 2018-10-18 NOTE — Interval H&P Note (Signed)
History and Physical Interval Note:  10/18/2018 8:29 AM  Alex Ortiz  has presented today for surgery, with the diagnosis of relook.  The various methods of treatment have been discussed with the patient and family. After consideration of risks, benefits and other options for treatment, the patient has consented to  Procedure(s): LEFT HEART CATH AND CORONARY ANGIOGRAPHY (N/A) with possible PERCUTANEOUS CORONARY INTERVENTION as a surgical intervention.  The patient's history has been reviewed, patient examined, no change in status, stable for surgery.  I have reviewed the patient's chart and labs.  Questions were answered to the patient's satisfaction.    Cath Lab Visit (complete for each Cath Lab visit)  Clinical Evaluation Leading to the Procedure:   ACS: Yes.    Non-ACS:    Anginal Classification: CCS IV  Anti-ischemic medical therapy: Minimal Therapy (1 class of medications)  Non-Invasive Test Results: No non-invasive testing performed  Prior CABG: No previous CABG   Bryan Lemma

## 2018-10-18 NOTE — Progress Notes (Signed)
PROGRESS NOTE  Alex Ortiz:[id number] DOB: 12/19/1985 DOA: 10/13/2018 PCP: Alex Ortiz, No Pcp Per   LOS: 3 days   Brief narrative: Alex Ortiz is a32 y.o.malewith a history of asthma, tobacco use, marijuana use the ED with chest pain, subsequently found to have non-STEMI. Alex Ortiz underwent LHC on 5/18. Found to have 95% stenosis of the proximal LAD. Per cardiology note, Alex Ortiz most likely had type I MI related to coronary artery dissection and subsequent thrombus. Postprocedure, Alex Ortiz has been started on tirofiban drip.  Alex Ortiz underwent repeat cardiac catheterization today.  Continues to have stenosis in LAD.  LAD stent placed.    Subjective: Alex Ortiz was seen and examined this afternoon.  No distress.  Assessment/Plan:  Principal Problem:   Non-ST elevation (NSTEMI) myocardial infarction Alex Ortiz(HCC) Active Problems:   Asthma   Hyperlipidemia   Chest pain   Angina at rest Alex Ortiz(HCC)   Coronary artery disease involving native coronary artery of native heart with unstable angina pectoris (HCC)   Non-STEMI: Underwent cardiac catheterization on 5/18 and a repeat cardiac cath on 5/20.  Findings as above.  LAD stent placed.  Continue aspirin/Plavix/statin/beta-blocker.  Asthma:Stable without any signs of exacerbation. Continue with as needed bronchodilators.  Tobacco abuse: will need counseling prior to discharge  COVID 19 screen:Negative.  Mobility:Encourage ambulation Diet:Cardiac diet DVT prophylaxis:Currently on tirofiban drip Code Status:Code Status: Full Code Disposition Plan:Likely home tomorrow.  Consultants:  Cardiology  Procedures:  Cardiac cath today. See above.  Antimicrobials:  Anti-infectives (From admission, onward)   None      Infusions:  . sodium chloride    . sodium chloride 100 mL/hr at 10/18/18 1202  . sodium chloride      Scheduled Meds: . aspirin EC  81 mg Oral Daily  . atorvastatin  80 mg Oral q1800  . clopidogrel  75  mg Oral Q breakfast  . metoprolol tartrate  12.5 mg Oral BID  . pantoprazole  40 mg Oral Daily  . sodium chloride flush  3 mL Intravenous Q12H  . sodium chloride flush  3 mL Intravenous Q12H  . sodium chloride flush  3 mL Intravenous Q12H    PRN meds: sodium chloride, sodium chloride, acetaminophen, albuterol, hydrALAZINE, labetalol, morphine injection, ondansetron (ZOFRAN) IV, sodium chloride flush, sodium chloride flush   Objective: Vitals:   10/18/18 1000 10/18/18 1105  BP:  (!) 127/91  Pulse: 64 70  Resp: 16 10  Temp:  98.5 F (36.9 C)  SpO2: 95% 100%    Intake/Output Summary (Last 24 hours) at 10/18/2018 1505 Last data filed at 10/18/2018 0730 Gross per 24 hour  Intake 931.44 ml  Output 450 ml  Net 481.44 ml   Filed Weights   10/14/18 0128 10/16/18 0404 10/18/18 0341  Weight: 84.1 kg 82.5 kg 82.9 kg   Weight change:  Body mass index is 27.79 kg/m.   Physical Exam: General exam: Appears calm and comfortable.  Skin: No rashes, lesions or ulcers. HEENT: Atraumatic, normocephalic, supple neck, no obvious bleeding Lungs: Clear to auscultation bilaterally CVS: Regular rate and rhythm, no murmur GI/Abd soft, nontender, nondistended, bowel sound present CNS: Alert, awake, oriented x3 Psychiatry: Mood appropriate Extremities: No pedal edema, no calf tenderness  Data Review: I have personally reviewed the laboratory data and studies available.  Recent Labs  Lab 10/14/18 0205 10/16/18 0256 10/16/18 1932 10/17/18 0249 10/18/18 0707  WBC 9.9 7.3 6.7 5.7 5.5  HGB 14.8 14.7 14.4 13.6 13.5  HCT 45.5 45.5 44.4 42.2 41.9  MCV 89.9 89.7 89.5 89.8 90.3  PLT 412* 415* 436* 388 393   Recent Labs  Lab 10/13/18 1657 10/14/18 0205 10/16/18 0256 10/17/18 0249 10/18/18 0707  NA 139 136 136 135 138  K 3.9 3.9 3.5 3.6 3.5  CL 105 101 100 103 103  CO2 26 23 23 22 24   GLUCOSE 122* 108* 98 102* 100*  BUN 13 8 12 9 10   CREATININE 1.13 1.06 1.28* 1.18 1.12  CALCIUM 9.5  9.5 9.5 9.0 9.3    Lorin Glass, MD  [facility] 10/18/2018

## 2018-10-19 ENCOUNTER — Other Ambulatory Visit: Payer: Self-pay | Admitting: Cardiology

## 2018-10-19 ENCOUNTER — Telehealth: Payer: Self-pay | Admitting: *Deleted

## 2018-10-19 DIAGNOSIS — J4520 Mild intermittent asthma, uncomplicated: Secondary | ICD-10-CM

## 2018-10-19 LAB — CBC
HCT: 41.6 % (ref 39.0–52.0)
Hemoglobin: 13.6 g/dL (ref 13.0–17.0)
MCH: 29.4 pg (ref 26.0–34.0)
MCHC: 32.7 g/dL (ref 30.0–36.0)
MCV: 89.8 fL (ref 80.0–100.0)
Platelets: 394 10*3/uL (ref 150–400)
RBC: 4.63 MIL/uL (ref 4.22–5.81)
RDW: 12 % (ref 11.5–15.5)
WBC: 6.6 10*3/uL (ref 4.0–10.5)
nRBC: 0 % (ref 0.0–0.2)

## 2018-10-19 LAB — HEMOGLOBIN A1C
Hgb A1c MFr Bld: 4.5 % — ABNORMAL LOW (ref 4.8–5.6)
Mean Plasma Glucose: 82.45 mg/dL

## 2018-10-19 LAB — BASIC METABOLIC PANEL
Anion gap: 9 (ref 5–15)
BUN: 9 mg/dL (ref 6–20)
CO2: 24 mmol/L (ref 22–32)
Calcium: 9.2 mg/dL (ref 8.9–10.3)
Chloride: 104 mmol/L (ref 98–111)
Creatinine, Ser: 1.23 mg/dL (ref 0.61–1.24)
GFR calc Af Amer: >60 mL/min (ref >60)
GFR calc non Af Amer: >60 mL/min (ref >60)
Glucose, Bld: 102 mg/dL — ABNORMAL HIGH (ref 70–99)
Potassium: 3.7 mmol/L (ref 3.5–5.1)
Sodium: 137 mmol/L (ref 135–145)

## 2018-10-19 LAB — POCT ACTIVATED CLOTTING TIME: Activated Clotting Time: 312 seconds

## 2018-10-19 MED ORDER — METOPROLOL SUCCINATE ER 25 MG PO TB24: 25.0000 mg | ORAL_TABLET | Freq: Every day | ORAL | 1 refills | Status: DC

## 2018-10-19 MED ORDER — ALBUTEROL SULFATE (2.5 MG/3ML) 0.083% IN NEBU: 3.0000 mL | INHALATION_SOLUTION | Freq: Four times a day (QID) | RESPIRATORY_TRACT | 1 refills | Status: DC | PRN

## 2018-10-19 MED ORDER — CLOPIDOGREL BISULFATE 75 MG PO TABS: 75.0000 mg | ORAL_TABLET | Freq: Every day | ORAL | 2 refills | Status: DC

## 2018-10-19 MED ORDER — ALBUTEROL SULFATE HFA 108 (90 BASE) MCG/ACT IN AERS: 2.0000 | INHALATION_SPRAY | Freq: Four times a day (QID) | RESPIRATORY_TRACT | 2 refills | Status: DC | PRN

## 2018-10-19 MED ORDER — METOPROLOL SUCCINATE ER 25 MG PO TB24
25.0000 mg | ORAL_TABLET | Freq: Every day | ORAL | Status: DC
  Administered 2018-10-19: 25 mg via ORAL
  Filled 2018-10-19: qty 1

## 2018-10-19 MED ORDER — NITROGLYCERIN 0.4 MG SL SUBL: 0.4000 mg | SUBLINGUAL_TABLET | SUBLINGUAL | 2 refills | Status: AC | PRN

## 2018-10-19 MED ORDER — ASPIRIN 81 MG PO TBEC: 81.0000 mg | DELAYED_RELEASE_TABLET | Freq: Every day | ORAL | 2 refills | Status: DC

## 2018-10-19 MED ORDER — ATORVASTATIN CALCIUM 80 MG PO TABS: 80.0000 mg | ORAL_TABLET | Freq: Every day | ORAL | 1 refills | Status: DC

## 2018-10-19 MED FILL — NITROGLYCERIN 0.4 MG TAB SL: 0.4 | 8 days supply | Qty: 25 | Fill #0 | Status: TO

## 2018-10-19 MED FILL — ALBUTEROL SUL 2.5 MG/3 ML S: (2.5 MG/3ML | 8 days supply | Qty: 90 | Fill #0 | Status: TO

## 2018-10-19 MED FILL — ATORVASTATIN CALCIUM 80 MG: 80 | 30 days supply | Qty: 30 | Fill #0 | Status: TO

## 2018-10-19 MED FILL — CLOPIDOGREL 75 MG TABLET: 75 | 30 days supply | Qty: 30 | Fill #0 | Status: TO

## 2018-10-19 MED FILL — ASPIRIN LOW DOSE 81 MG TBEC: 81 | 30 days supply | Qty: 30 | Fill #0 | Status: TO

## 2018-10-19 MED FILL — METOPROLOL SUCCINATE ER 25: 25 | 30 days supply | Qty: 30 | Fill #0 | Status: TO

## 2018-10-19 NOTE — Progress Notes (Signed)
Cardiac Rehab Advisory Cardiac Rehab Phase I is not seeing pts face to face at this time due to Covid 19 restrictions. Ambulation is occurring through nursing, PT, and mobility teams. We will help facilitate that process as needed. We are calling pts in their rooms and discussing education. We will then deliver education materials to pts RN for delivery to pt.   Spoke with pt by phone. Discussed MI, stent, restrictions, meds including Plavix/ASA, smoking cessation, diet, exercising at home, NTG, and CRPII. Good reception, motivated to quit smoking and care for his heart. Will refer to [place] CRPII. Understands the importance of Plavix. [id number] Ethelda Chick CES, ACSM 9:20 AM 10/19/2018

## 2018-10-19 NOTE — Telephone Encounter (Signed)
Pt called regarding nebulizer.  He was discharged with neb Rx but he does not have neb machine.  Will consult unit CM to assist with this matter.

## 2018-10-19 NOTE — Progress Notes (Signed)
Removed PIV access x 2 and pt received discharge instructions. Pt understood it very well. Arranged PCP & [facility] pharmacy will bring the medications. Pt took his all belongings. HS [employer]

## 2018-10-19 NOTE — Discharge Instructions (Signed)
YOUR CARDIOLOGY TEAM HAS ARRANGED FOR AN E-VISIT FOR YOUR APPOINTMENT - PLEASE REVIEW IMPORTANT INFORMATION BELOW SEVERAL DAYS PRIOR TO YOUR APPOINTMENT  Due to the recent COVID-19 pandemic, we are transitioning in-person office visits to tele-medicine visits in an effort to decrease unnecessary exposure to our patients, their families, and staff. These visits are billed to your insurance just like a normal visit is. We also encourage you to sign up for MyChart if you have not already done so. You will need a smartphone if possible. For patients that do not have this, we can still complete the visit using a regular telephone but do prefer a smartphone to enable video when possible. You may have a family member that lives with you that can help. If possible, we also ask that you have a blood pressure cuff and scale at home to measure your blood pressure, heart rate and weight prior to your scheduled appointment. Patients with clinical needs that need an in-person evaluation and testing will still be able to come to the office if absolutely necessary. If you have any questions, feel free to call our office.     YOUR PROVIDER WILL BE USING THE FOLLOWING PLATFORM TO COMPLETE YOUR VISIT: [email or web address]  . IF USING MYCHART - How to Download the MyChart App to Your SmartPhone   - If Apple, go to App Store and type in MyChart in the search bar and download the app. If Android, ask patient to go to Google Play Store and type in MyChart in the search bar and download the app. The app is free but as with any other app downloads, your phone may require you to verify saved payment information or Apple/Android password.  - You will need to then log into the app with your MyChart username and password, and select [facility] as your healthcare provider to link the account.  - When it is time for your visit, go to the MyChart app, find appointments, and click Begin Video Visit. Be sure to Select Allow for your device to  access the Microphone and Camera for your visit. You will then be connected, and your provider will be with you shortly.  **If you have any issues connecting or need assistance, please contact MyChart service desk [phone] ([phone])**  **If using a computer, in order to ensure the best quality for your visit, you will need to use either of the following Internet Browsers: Google Chrome or Microsoft Edge**  . IF USING DOXIMITY or [email or web address] - The staff will give you instructions on receiving your link to join the meeting the day of your visit.      2-3 DAYS BEFORE YOUR APPOINTMENT  You will receive a telephone call from one of our [facility] team members - your caller ID may say "Unknown caller." If this is a video visit, we will walk you through how to get the video launched on your phone. We will remind you check your blood pressure, heart rate and weight prior to your scheduled appointment. If you have an Apple Watch or Kardia, please upload any pertinent ECG strips the day before or morning of your appointment to MyChart. Our staff will also make sure you have reviewed the consent and agree to move forward with your scheduled tele-health visit.     THE DAY OF YOUR APPOINTMENT  Approximately 15 minutes prior to your scheduled appointment, you will receive a telephone call from one of [facility] team - your caller ID may say "Unknown caller."    Our staff will confirm medications, vital signs for the day and any symptoms you may be experiencing. Please have this information available prior to the time of visit start. It may also be helpful for you to have a pad of paper and pen handy for any instructions given during your visit. They will also walk you through joining the smartphone meeting if this is a video visit.    CONSENT FOR TELE-HEALTH VISIT - PLEASE REVIEW  I hereby voluntarily request, consent and authorize [facility] and its employed or contracted physicians, physician  assistants, nurse practitioners or other licensed health care professionals (the Practitioner), to provide me with telemedicine health care services (the "Services") as deemed necessary by the treating Practitioner. I acknowledge and consent to receive the Services by the Practitioner via telemedicine. I understand that the telemedicine visit will involve communicating with the Practitioner through live audiovisual communication technology and the disclosure of certain medical information by electronic transmission. I acknowledge that I have been given the opportunity to request an in-person assessment or other available alternative prior to the telemedicine visit and am voluntarily participating in the telemedicine visit.  I understand that I have the right to withhold or withdraw my consent to the use of telemedicine in the course of my care at any time, without affecting my right to future care or treatment, and that the Practitioner or I may terminate the telemedicine visit at any time. I understand that I have the right to inspect all information obtained and/or recorded in the course of the telemedicine visit and may receive copies of available information for a reasonable fee.  I understand that some of the potential risks of receiving the Services via telemedicine include:  . Delay or interruption in medical evaluation due to technological equipment failure or disruption; . Information transmitted may not be sufficient (e.g. poor resolution of images) to allow for appropriate medical decision making by the Practitioner; and/or  . In rare instances, security protocols could fail, causing a breach of personal health information.  Furthermore, I acknowledge that it is my responsibility to provide information about my medical history, conditions and care that is complete and accurate to the best of my ability. I acknowledge that Practitioner's advice, recommendations, and/or decision may be based on  factors not within their control, such as incomplete or inaccurate data provided by me or distortions of diagnostic images or specimens that may result from electronic transmissions. I understand that the practice of medicine is not an exact science and that Practitioner makes no warranties or guarantees regarding treatment outcomes. I acknowledge that I will receive a copy of this consent concurrently upon execution via email to the email address I last provided but may also request a printed copy by calling the office of [facility].    I understand that my insurance will be billed for this visit.   I have read or had this consent read to me. . I understand the contents of this consent, which adequately explains the benefits and risks of the Services being provided via telemedicine.  . I have been provided ample opportunity to ask questions regarding this consent and the Services and have had my questions answered to my satisfaction. . I give my informed consent for the services to be provided through the use of telemedicine in my medical care  By participating in this telemedicine visit I agree to the above.  

## 2018-10-19 NOTE — Care Management (Signed)
CM informed after discharge pt does not in fact have nebulizer in the home.  CM spoke with pt directly - pt in agreement for [employer] to deliver nebulizer to home - pt also request inhaler for when he is not at home. CM requested Cards PA to order neb for the home.    CM also requested cards PA to order inhaler for immediate availability as needed during the interim while equipment is out for delivery and for when pt is out of the home.    Cards PA  Ordered DME nebulizer and  Faxed inhaler order  into pharmacy of choice [facility] on [address].  [employer] contacted and will accept DME through charity account and will deliver directly to pts home.  CM educated pt on both nebulizer and inhaler he should use either or and not both at the same time, pt also instructed to  adhere to parameters of frequency documented on labels of medications.   Pt acknowledged teaching with feedback method.   Pt also informed that if he has additional questions to contact [employer] regarding the nebulizer and PCP if he has a medical question.

## 2018-10-19 NOTE — Discharge Summary (Addendum)
The patient has been seen in conjunction with [name], NP. All aspects of care have been considered and discussed. The patient has been personally interviewed, examined, and all clinical data has been reviewed.   Please see my comments from earlier this AM.   Discharge Summary    Patient ID: Alex Ortiz,  MRN: [id number], DOB/AGE: [birth year] 33 y.o.  Admit date: 10/13/2018 Discharge date: 10/19/2018  Primary Care Provider: Patient, No Pcp Per Primary Cardiologist: Dr. Katrinka Blazing   Discharge Diagnoses    Principal Problem:   Non-ST elevation (NSTEMI) myocardial infarction [facility]) Active Problems:   Asthma   Hyperlipidemia   Chest pain   Angina at rest [facility])   Coronary artery disease involving native coronary artery of native heart with unstable angina pectoris (HCC)   Allergies No Known Allergies  Diagnostic Studies/Procedures    Cath: 10/16/18   Prox LAD lesion is 95% stenosed.  Ost LAD to Prox LAD lesion is 30% stenosed.   IMPRESSION: Mr. Eisenhardt has a high-grade proximal LAD stenosis with associated thrombus and some hypodensity in tapered appearing vessel beginning at the origin of the LAD on the left main raising the suspicion that this is coronary dissection (S CAD) with associated thrombus.  He has no other atherosclerosis or risk factors.  His EF has improved by left-ventricular agree.  I reviewed his angiograms with Drs. Eldridge Dace and Katrinka Blazing and the consensus is that we will put him on Aggrastat for 36 hours and then relook at his coronary circulation.  I am also be going to begin him on Plavix.  We will transfer him from 6 E. to see for closer observation.  The sheath was removed and a TR band was placed on the right wrist to achieve patent hemostasis.  The patient left lab in stable condition.  [name]. MD, [facility]  Diagnostic  Dominance: Right    Cath: 10/18/18   Prox LAD lesion is 90% stenosed with 75% stenosed side branch in Ost 1st Sept.  A  drug-eluting stent was successfully placed using a STENT RESOLUTE ONYX 3.5X18. - Post-dilated to 4.1 mm  Post intervention, there is a 0% residual stenosis.  Post intervention, the SP1 side branch stable at 75% residual stenosis - potential plaque shift  Ost LAD to Prox LAD lesion is 35% stenosed.   SUMMARY  Severe single-vessel disease with some improvement of the LAD lesion from initial diagnostic catheterization after Aggrastat, but persistent 90% proximal LAD after 1st Diag branch, at SP1.  Successful DES PCI using resolute Onyx DES 3.5 mm x 18 mm postdilated 4.1 mm.  RECOMMENDATIONS  Transfer to nursing unit for ongoing care post PCI.  Aggressive risk factor modification  Dual antiplatelet therapy for minimum 1 year.  6 and 40   Bryan Lemma, M.D., M.S.  Diagnostic  Dominance: Right    Intervention     _____________   History of Present Illness     33 yo male with only hx of asthma who presented with chest pain and found to have abnormal troponins.   Mr. Sensabaugh was in his usual state of excellent health until the afternoon of admission when he developed sharp, substernal chest pain after walking home from the store. The pain was associated with a numbness in both of his arm, was not pleuritic in nature, though did appear to be somewhat positional (as he felt better sitting up). He presented to the ER where, while in triage, he felt a squeezing sensation - "like I  was having a heart attack".   His ER evaluation included a CTA for dissection which was negative, lab work up which was largely unremarkable execept for his troponins which trended from 0.03 -> 0.09 -> 0.31. CT also without cardiomegaly, pericaridal effusion, or pericardial thickening. No PE. His EKG was unchanged from prior with LVH pattern in the anterior and lateral leads similar to prior. He was given pain medication with some transient improvement in his pain.   On review of system he denies recent  fever, nightsweats, or cough. No sick contacts. No recent URI. No LE edema, SOB, orthopnea. No personal history of heart problems. +THC use; denied cocaine.   Hospital Course     Troponin continued to trend up. Echo showed EF of 40-45% with akinesis of the apical, septal, inferior, anterior, lateral, inferolateral and apical segments. Troponin trending up to 23.91. The need for cardiac cath was discussed with the patient. Underwent cath with Dr. Allyson Sabal noted with concern for SCAD vs plaque rupture in the LAD. He was placed on aggrastat for 36 hours with plans for re-look cath. Cardiology assumed care of the patient post cath. Placed on BB, statin, ASA and plavix. LDL 127. Underwent re-look cath with Dr. Herbie Baltimore with improvement to osital LAD but persistent lesion in the pLAD, therefore decision made to proceed with PCI/DES to the pLAD. Continued on DAPT with ASA/plavix for one year. Cardiac Rehab phase I completed via phone 2/2 to COVID precautions. LP(a), hs- CRP and Hgb A1c where drawn prior to discharge which will be follow up on a follow up appt. Tobacco cessation was discussed this admission.    Elizeo Torok was seen by Dr. Katrinka Blazing and determined stable for discharge home. Follow up in the office has been arranged. Medications are listed below.   _____________  Discharge Vitals Blood pressure 131/77, pulse 85, temperature 98.3 F (36.8 C), temperature source Oral, resp. rate 14, height  (1.727 m), weight 82.9 kg, SpO2 97 %.  Filed Weights   10/14/18 0128 10/16/18 0404 10/18/18 0341  Weight: 84.1 kg 82.5 kg 82.9 kg    Labs & Radiologic Studies    CBC Recent Labs    10/18/18 0707 10/19/18 0230  WBC 5.5 6.6  HGB 13.5 13.6  HCT 41.9 41.6  MCV 90.3 89.8  PLT 393 394   Basic Metabolic Panel Recent Labs    16/10/96 0707 10/19/18 0230  NA 138 137  K 3.5 3.7  CL 103 104  CO2 24 24  GLUCOSE 100* 102*  BUN 10 9  CREATININE 1.12 1.23  CALCIUM 9.3 9.2   Liver Function  Tests No results for input(s): AST, ALT, ALKPHOS, BILITOT, PROT, ALBUMIN in the last 72 hours. No results for input(s): LIPASE, AMYLASE in the last 72 hours. Cardiac Enzymes No results for input(s): CKTOTAL, CKMB, CKMBINDEX, TROPONINI in the last 72 hours. BNP Invalid input(s): POCBNP D-Dimer No results for input(s): DDIMER in the last 72 hours. Hemoglobin A1C Recent Labs    10/19/18 0745  HGBA1C 4.5*   Fasting Lipid Panel No results for input(s): CHOL, HDL, LDLCALC, TRIG, CHOLHDL, LDLDIRECT in the last 72 hours. Thyroid Function Tests No results for input(s): TSH, T4TOTAL, T3FREE, THYROIDAB in the last 72 hours.  Invalid input(s): FREET3 _____________  Dg Chest 2 View  Result Date: 10/13/2018 CLINICAL DATA:  Chest pain. EXAM: CHEST - 2 VIEW COMPARISON:  Chest x-ray and CT chest dated September 23, 2016. FINDINGS: The heart size and mediastinal contours are within normal limits.  Both lungs are clear. The visualized skeletal structures are unremarkable. IMPRESSION: No active cardiopulmonary disease. Electronically Signed   By: Obie DredgeWilliam T Derry M.D.   On: 10/13/2018 19:19   Ct Angio Chest/abd/pel For Dissection W And/or Wo Contrast  Result Date: 10/13/2018 CLINICAL DATA:  Chest and abdomen pain EXAM: CT ANGIOGRAPHY CHEST, ABDOMEN AND PELVIS TECHNIQUE: Initially, axial CT images were obtained through the chest without intravenous contrast material administration. Multidetector CT imaging through the chest, abdomen and pelvis was performed using the standard protocol during bolus administration of intravenous contrast. Multiplanar reconstructed images and MIPs were obtained and reviewed to evaluate the vascular anatomy. CONTRAST:  100mL OMNIPAQUE IOHEXOL 350 MG/ML SOLN COMPARISON:  Chest CT September 23, 2016; chest radiograph Oct 13, 2018 FINDINGS: CTA CHEST FINDINGS Cardiovascular: There is no intramural hematoma within the thoracic aorta on the noncontrast enhanced study. There is no demonstrable  thoracic aortic aneurysm or dissection. The visualized great vessels appear unremarkable. Note that the right innominate and left common carotid arteries arise as a common trunk, an anatomic variant. No pulmonary embolus is appreciable. There is no pericardial effusion or pericardial thickening evident. Mediastinum/Nodes: Thyroid appears unremarkable. There is an enlarged right hilar lymph node measuring 1.4 x 1.4 cm. There are scattered subcentimeter mediastinal lymph nodes and axillary lymph nodes. No other lymph node enlargement is evident. There is a small hiatal hernia. Lungs/Pleura: There is mild bibasilar atelectasis. No pneumothorax. No edema or consolidation. No pleural effusion or pleural thickening evident. Musculoskeletal: There are no blastic or lytic bone lesions. No evident fracture or dislocation. No chest wall lesions evident. Review of the MIP images confirms the above findings. CTA ABDOMEN AND PELVIS FINDINGS VASCULAR Aorta: There is no abdominal aortic aneurysm or dissection. No appreciable atherosclerotic change. Celiac: Celiac artery and its branches appear widely patent. No aneurysm or dissection evident. SMA: Superior mesenteric artery and its branches appear widely patent. No aneurysm or dissection evident. Renals: There are 2 renal arteries on each side, with one of the arteries on each side significantly larger than the other. Renal arteries bilaterally appear widely patent in all segments. No aneurysm or dissection. No fibromuscular dysplasia evident on either side. IMA: Inferior mesenteric artery in its branches are widely patent. No aneurysm or dissection evident. Inflow: The major pelvic arterial vessels are widely patent throughout their respective courses. No appreciable atherosclerotic change noted. No aneurysm or dissection involving major pelvic arterial vessels. Proximal superficial and profunda femoral arteries are also widely patent without aneurysm or dissection. Veins: No  obvious venous abnormality within the limitations of this arterial phase study. Review of the MIP images confirms the above findings. NON-VASCULAR Hepatobiliary: No focal liver lesions are evident. The gallbladder wall is not appreciably thickened. There is no biliary duct dilatation. Pancreas: There is no pancreatic mass or inflammatory focus. Spleen: No splenic lesions are evident. Adrenals/Urinary Tract: Adrenals bilaterally appear normal. Kidneys bilaterally show no evident mass or hydronephrosis on either side. There is no appreciable renal or ureteral calculus. Urinary bladder is midline with wall thickness within normal limits. Stomach/Bowel: There is no appreciable bowel wall or mesenteric thickening. No evident bowel obstruction. Terminal ileum appears unremarkable. No free air or portal venous air evident. Lymphatic: There is no evident adenopathy in the abdomen or pelvis. Reproductive: Prostate and seminal vesicles appear normal in size and contour. No evident pelvic mass. Other: The appendix appears normal. There is no abscess or ascites in the abdomen or pelvis. Musculoskeletal: No evident fracture or dislocation. No blastic or  lytic bone lesions. No intramuscular or abdominal wall lesions evident. Review of the MIP images confirms the above findings. IMPRESSION: CT angiogram chest: 1. No thoracic aortic aneurysm or dissection. No intramural hematoma. 2.  No demonstrable pulmonary embolus. 3.  Areas of mild atelectasis.  No lung edema or consolidation. 4. Enlarged right hilar lymph node. No other adenopathy evident. Etiology for this single enlarged lymph node is uncertain. 5.  Small hiatal hernia. CT angiogram abdomen; CT angiogram pelvis: 1. No aneurysm or dissection involving the aorta, major pelvic, or major mesenteric vessels. No appreciable obstructive disease. No appreciable atherosclerotic calcification. No fibromuscular dysplasia. 2. No evident bowel wall thickening or bowel obstruction. No  abscess in the abdomen pelvis. Appendix appears normal. 3. No renal or ureteral calculus. No hydronephrosis on either side. Urinary bladder wall thickness is within normal limits. Electronically Signed   By: Bretta Bang III M.D.   On: 10/13/2018 19:57   Disposition   Pt is being discharged home today in good condition.  Follow-up Plans & Appointments   Follow-up Information    Lyn Records, MD Follow up on 10/26/2018.   Specialty:  Cardiology Why:  at 10am for your follow up appt. This will be a tele health visit through your mobile phone.  Contact information: [address] [place] [address] [phone]          Discharge Instructions    AMB Referral to Cardiac Rehabilitation - Phase II   Complete by:  As directed    Diagnosis:  NSTEMI   After initial evaluation and assessments completed: Virtual Based Care may be provided alone or in conjunction with Phase 2 Cardiac Rehab based on patient barriers.:  Yes   AMB Referral to Cardiac Rehabilitation - Phase II   Complete by:  As directed    Diagnosis:   Coronary Stents NSTEMI     After initial evaluation and assessments completed: Virtual Based Care may be provided alone or in conjunction with Phase 2 Cardiac Rehab based on patient barriers.:  Yes   Call MD for:  redness, tenderness, or signs of infection (pain, swelling, redness, odor or green/yellow discharge around incision site)   Complete by:  As directed    Diet - low sodium heart healthy   Complete by:  As directed    Discharge instructions   Complete by:  As directed    Radial Site Care Refer to this sheet in the next few weeks. These instructions provide you with information on caring for yourself after your procedure. Your caregiver may also give you more specific instructions. Your treatment has been planned according to current medical practices, but problems sometimes occur. Call your caregiver if you have any problems or questions after  your procedure. HOME CARE INSTRUCTIONS You may shower the day after the procedure.Remove the bandage (dressing) and gently wash the site with plain soap and water.Gently pat the site dry.  Do not apply powder or lotion to the site.  Do not submerge the affected site in water for 3 to 5 days.  Inspect the site at least twice daily.  Do not flex or bend the affected arm for 24 hours.  No lifting over 5 pounds (2.3 kg) for 5 days after your procedure.  Do not drive home if you are discharged the same day of the procedure. Have someone else drive you.  You may drive 24 hours after the procedure unless otherwise instructed by your caregiver.  What to expect: Any  bruising will usually fade within 1 to 2 weeks.  Blood that collects in the tissue (hematoma) may be painful to the touch. It should usually decrease in size and tenderness within 1 to 2 weeks.  SEEK IMMEDIATE MEDICAL CARE IF: You have unusual pain at the radial site.  You have redness, warmth, swelling, or pain at the radial site.  You have drainage (other than a small amount of blood on the dressing).  You have chills.  You have a fever or persistent symptoms for more than 72 hours.  You have a fever and your symptoms suddenly get worse.  Your arm becomes pale, cool, tingly, or numb.  You have heavy bleeding from the site. Hold pressure on the site.   PLEASE DO NOT MISS ANY DOSES OF YOUR PLAVIX!!!!! Also keep a log of you blood pressures and bring back to your follow up appt. Please call the office with any questions.   Patients taking blood thinners should generally stay away from medicines like ibuprofen, Advil, Motrin, naproxen, and Aleve due to risk of stomach bleeding. You may take Tylenol as directed or talk to your primary doctor about alternatives.   Increase activity slowly   Complete by:  As directed       Discharge Medications     Medication List    TAKE these medications   albuterol (2.5 MG/3ML) 0.083%  nebulizer solution Commonly known as:  PROVENTIL Inhale 3 mLs into the lungs every 6 (six) hours as needed for wheezing or shortness of breath.   aspirin 81 MG EC tablet Take 1 tablet (81 mg total) by mouth daily.   atorvastatin 80 MG tablet Commonly known as:  LIPITOR Take 1 tablet (80 mg total) by mouth daily at 6 PM.   clopidogrel 75 MG tablet Commonly known as:  PLAVIX Take 1 tablet (75 mg total) by mouth daily with breakfast. Start taking on:  Oct 20, 2018   metoprolol succinate 25 MG 24 hr tablet Commonly known as:  TOPROL-XL Take 1 tablet (25 mg total) by mouth daily.   nitroGLYCERIN 0.4 MG SL tablet Commonly known as:  Nitrostat Place 1 tablet (0.4 mg total) under the tongue every 5 (five) minutes as needed.        Acute coronary syndrome (MI, NSTEMI, STEMI, etc) this admission?: Yes.     AHA/ACC Clinical Performance & Quality Measures: 2. Aspirin prescribed? - Yes 3. ADP Receptor Inhibitor (Plavix/Clopidogrel, Brilinta/Ticagrelor or Effient/Prasugrel) prescribed (includes medically managed patients)? - Yes 4. Beta Blocker prescribed? - Yes 5. High Intensity Statin (Lipitor 40-80mg  or Crestor 20-40mg ) prescribed? - Yes 6. EF assessed during THIS hospitalization? - Yes 7. For EF <40%, was ACEI/ARB prescribed? - Not Applicable (EF >/= 40%) 8. For EF <40%, Aldosterone Antagonist (Spironolactone or Eplerenone) prescribed? - Not Applicable (EF >/= 40%) 9. Cardiac Rehab Phase II ordered (Included Medically managed Patients)? - Yes    Outstanding Labs/Studies   FLP/LFTs in 6 weeks   Duration of Discharge Encounter   Greater than 30 minutes including physician time.  Signed, [name] NP-C 10/19/2018, 9:22 AM

## 2018-10-19 NOTE — TOC Transition Note (Signed)
Transition of Care [facility]) - CM/SW Discharge Note   Patient Details  Name: Dael Cannada MRN: [id number] Date of Birth: [birth year]  [facility] CM/SW Contact:  Cherylann Parr, RN Phone Number: 10/19/2018, 10:09 AM   Clinical Narrative:  Pt to discharge home today, pt independent from home.   Pt confirms he does not have insurance however can pay for discharge meds out of pocket - total given to pt and [facility] pharmacy will deliver prior to discharge.  CM arranged earliest appt available for pt with [facility] - pt will have follow up with cardiology within a week from discharge (unit secretary will arrange).  NO other CM needs determined - CM signing off    Final next level of care: Home/Self Care Barriers to Discharge: Barriers Resolved   Patient Goals and CMS Choice        Discharge Placement                       Discharge Plan and Services                                     Social Determinants of Health (SDOH) Interventions     Readmission Risk Interventions No flowsheet data found.

## 2018-10-19 NOTE — Progress Notes (Signed)
Progress Note  Patient Name: Alex Ortiz Date of Encounter: 10/19/2018  Primary Cardiologist: Verdis Prime, MD  Subjective   Feels well. No problems over night.   Inpatient Medications    Scheduled Meds: . aspirin EC  81 mg Oral Daily  . atorvastatin  80 mg Oral q1800  . clopidogrel  75 mg Oral Q breakfast  . metoprolol tartrate  12.5 mg Oral BID  . pantoprazole  40 mg Oral Daily  . sodium chloride flush  3 mL Intravenous Q12H  . sodium chloride flush  3 mL Intravenous Q12H  . sodium chloride flush  3 mL Intravenous Q12H   Continuous Infusions: . sodium chloride    . sodium chloride     PRN Meds: sodium chloride, sodium chloride, acetaminophen, albuterol, morphine injection, ondansetron (ZOFRAN) IV, sodium chloride flush, sodium chloride flush   Vital Signs    Vitals:   10/18/18 2000 10/18/18 2132 10/19/18 0000 10/19/18 0329  BP: 125/81 126/81 131/87 116/72  Pulse:  63 63 61  Resp: 14  10 18   Temp: 99 F (37.2 C)  98.5 F (36.9 C) 98.3 F (36.8 C)  TempSrc: Oral  Oral Oral  SpO2: 99%  99% 97%  Weight:      Height:        Intake/Output Summary (Last 24 hours) at 10/19/2018 0736 Last data filed at 10/19/2018 0400 Gross per 24 hour  Intake 929.03 ml  Output -  Net 929.03 ml   Last 3 Weights 10/18/2018 10/16/2018 10/14/2018  Weight (lbs) 182 lb 12.2 oz 181 lb 12.8 oz 185 lb 6.4 oz  Weight (kg) 82.9 kg 82.464 kg 84.097 kg      Telemetry    NSR - Personally Reviewed  ECG    Normal sinus rhythm, poor R wave progression V1 through V3, deep symmetrical precordial T wave inversion (Wellens T waves).  Findings represent evolution of anterior wall acute ischemic syndrome.- Personally Reviewed  Physical Exam  Feels well and excited about possibly being able to go home.  Tattoos on chest and arms. GEN: No acute distress.   Neck: No JVD Cardiac: RRR, no murmurs, rubs, or gallops.  Radial cath site is unremarkable. Respiratory: Clear to auscultation bilaterally.  GI: Soft, nontender, non-distended  MS: No edema; No deformity. Neuro:  Nonfocal  Psych: Normal affect   Labs    Chemistry Recent Labs  Lab 10/16/18 0256 10/17/18 0249 10/18/18 0707 10/19/18 0230  NA 136 135 138 137  K 3.5 3.6 3.5 3.7  CL 100 103 103 104  CO2 23 22 24 24   GLUCOSE 98 102* 100* 102*  BUN 12 9 10 9   CREATININE 1.28* 1.18 1.12 1.23  CALCIUM 9.5 9.0 9.3 9.2  PROT 7.1  --   --   --   ALBUMIN 3.9  --   --   --   AST 58*  --   --   --   ALT 33  --   --   --   ALKPHOS 52  --   --   --   BILITOT 1.6*  --   --   --   GFRNONAA >60 >60 >60 >60  GFRAA >60 >60 >60 >60  ANIONGAP 13 10 11 9      Hematology Recent Labs  Lab 10/17/18 0249 10/18/18 0707 10/19/18 0230  WBC 5.7 5.5 6.6  RBC 4.70 4.64 4.63  HGB 13.6 13.5 13.6  HCT 42.2 41.9 41.6  MCV 89.8 90.3 89.8  MCH 28.9 29.1  29.4  MCHC 32.2 32.2 32.7  RDW 12.1 12.1 12.0  PLT 388 393 394    Cardiac Enzymes Recent Labs  Lab 10/13/18 2007 10/13/18 2300 10/15/18 0445 10/15/18 0728  TROPONINI 0.09* 0.31* 23.91* 19.30*   No results for input(s): TROPIPOC in the last 168 hours.   BNPNo results for input(s): BNP, PROBNP in the last 168 hours.   DDimer No results for input(s): DDIMER in the last 168 hours.   Radiology    No results found.  Cardiac Studies   CARDIAC CATH/PCI  10/18/18: Diagnostic  Dominance: Right    Intervention       Patient Profile     33 y.o. 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  Repeat angiography scheduled for 10/18/18 --> LAD DES.   Assessment & Plan    1. Non-ST elevation myocardial infarction involving the anterior wall.  Underlying pathogenesis either atherosclerosis with plaque rupture or localized coronary dissection (type III).  Stent implantation 10/18/2018 without complications. 2. Hyperlipidemia with LDL greater than 120.  High intensity  statin therapy is recommended.  LP(a), hs-CRP, and hemoglobin A1c will be drawn prior to discharge. 3. Beta-blocker therapy should be consolidated as Toprol-XL/metoprolol succinate 25 mg once daily.  Cardiology transition of care follow-up 7 to 10 days with me.  Could be done as a virtual visit or in person.  It is important for the patient to see me as I am concerned about him being lost to follow-up and we have establish rapport.  For questions or updates, please contact [facility] Please consult [email or web address] for contact info under        Signed, Lesleigh Noe, MD  10/19/2018, 7:36 AM

## 2018-10-20 LAB — LIPOPROTEIN A (LPA): Lipoprotein (a): 79.5 nmol/L — ABNORMAL HIGH (ref <75.0)

## 2018-10-20 LAB — HIGH SENSITIVITY CRP: CRP, High Sensitivity: 5.92 mg/L — ABNORMAL HIGH (ref 0.00–3.00)

## 2018-10-25 NOTE — Progress Notes (Signed)
Virtual Visit via Video Note   This visit type was conducted due to national recommendations for restrictions regarding the COVID-19 Pandemic (e.g. social distancing) in an effort to limit this patient's exposure and mitigate transmission in our community.  Due to his co-morbid illnesses, this patient is at least at moderate risk for complications without adequate follow up.  This format is felt to be most appropriate for this patient at this time.  All issues noted in this document were discussed and addressed.  A limited physical exam was performed with this format.  Please refer to the patient's chart for his consent to telehealth for [facility].   Date:  10/26/2018   ID:  Alex Ortiz, DOB [birth year], MRN [id number]  Patient Location: Home Provider Location: Office  PCP:  Patient, No Pcp Per  Cardiologist:  Chrystie Nose, MD  Electrophysiologist:  None   Evaluation Performed:  Follow-Up Visit  Chief Complaint:  CAD  History of Present Illness:    Alex Ortiz is a 33 y.o. male with recent anterior NSTEMI treated with DES. 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  Having some anxiety because of fear that he may have a recurrent myocardial infarction.  He is compliant with the current medical regimen.  He has occasional palpitation and it frightens him.  He denies dyspnea, lightheadedness, and syncope.  He denies chest pain.  Concerned about erectile dysfunction.  He had this once or twice prior to his myocardial infarction over the past year.  His concern is that he is not able to get an erection under circumstances where he previously could.  He has an early morning erection on a daily basis.  The patient does not have symptoms concerning for COVID-19 infection (fever, chills, cough, or new shortness of breath).    Past Medical History:  Diagnosis Date  . Asthma    Past Surgical History:  Procedure  Laterality Date  . CORONARY STENT INTERVENTION N/A 10/18/2018   Procedure: CORONARY STENT INTERVENTION;  Surgeon: Marykay Lex, MD;  Location: [facility] INVASIVE CV LAB;  Service: Cardiovascular;  Laterality: N/A;  . HERNIA REPAIR    . LEFT HEART CATH AND CORONARY ANGIOGRAPHY N/A 10/16/2018   Procedure: LEFT HEART CATH AND CORONARY ANGIOGRAPHY;  Surgeon: Runell Gess, MD;  Location: [facility] INVASIVE CV LAB;  Service: Cardiovascular;  Laterality: N/A;     Current Meds  Medication Sig  . albuterol (VENTOLIN HFA) 108 (90 Base) MCG/ACT inhaler Inhale 2 puffs into the lungs every 6 (six) hours as needed for wheezing or shortness of breath.  Marland Kitchen aspirin EC 81 MG EC tablet Take 1 tablet (81 mg total) by mouth daily.  Marland Kitchen atorvastatin (LIPITOR) 80 MG tablet Take 1 tablet (80 mg total) by mouth daily at 6 PM.  . clopidogrel (PLAVIX) 75 MG tablet Take 1 tablet (75 mg total) by mouth daily with breakfast.  . metoprolol succinate (TOPROL-XL) 25 MG 24 hr tablet Take 1 tablet (25 mg total) by mouth daily.  . nitroGLYCERIN (NITROSTAT) 0.4 MG SL tablet Place 1 tablet (0.4 mg total) under the tongue every 5 (five) minutes as needed.     Allergies:   Patient has no known allergies.   Social History   Tobacco Use  . Smoking status: Current Every Day Smoker    Types: Cigarettes  . Smokeless tobacco: Never Used  . Tobacco comment: 2-3 cigarettes/day  Substance Use Topics  . Alcohol use: Yes  Comment: socially   . Drug use: Yes    Types: Marijuana     Family Hx: The patient's family history includes Heart disease in his father; Hypertension in his mother.  ROS:   Please see the history of present illness.    Anxiety, but otherwise no particular concerns.  Not sleeping well he feels, because he is frightened to go to sleep. All other systems reviewed and are negative.   Prior CV studies:   The following studies were reviewed today:  Recent cardiac cath and echo data from hospitalization were  reviewed.  EF is normal.  Proximal LAD drug-eluting stent.  Widely patent coronary arteries otherwise.  Labs/Other Tests and Data Reviewed:    EKG:  No ECG reviewed.  Recent Labs: 10/16/2018: ALT 33 10/19/2018: BUN 9; Creatinine, Ser 1.23; Hemoglobin 13.6; Platelets 394; Potassium 3.7; Sodium 137   Recent Lipid Panel Lab Results  Component Value Date/Time   CHOL 186 10/14/2018 02:05 AM   TRIG 70 10/14/2018 02:05 AM   HDL 45 10/14/2018 02:05 AM   CHOLHDL 4.1 10/14/2018 02:05 AM   LDLCALC 127 (H) 10/14/2018 02:05 AM    Wt Readings from Last 3 Encounters:  10/26/18 178 lb (80.7 kg)  10/18/18 182 lb 12.2 oz (82.9 kg)  04/18/17 190 lb (86.2 kg)     Objective:    Vital Signs:  Ht 5\' 8"  (1.727 m)   Wt 178 lb (80.7 kg)   BMI 27.06 kg/m    VITAL SIGNS:  reviewed GEN:  no acute distress RESPIRATORY:  normal respiratory effort, symmetric expansion CARDIOVASCULAR:  no peripheral edema NEURO:  alert and oriented x 3, no obvious focal deficit  ASSESSMENT & PLAN:    1. Atherosclerosis of native coronary artery of native heart without angina pectoris   2. Elevated BP without diagnosis of hypertension   3. Hyperlipidemia LDL goal <70   4. Educated About Covid-19 Virus Infection    PLAN:  1. Secondary prevention discussed in detail.  Gradual resumption of physical activity as recommended by cardiac rehab was read discussed. 2. No vital signs today.  Blood pressure target is given work 100-130/60-80 mmHg.  Lifestyle changes to improve blood pressure were discussed. 3. LDL target less than 70.  Currently on high intensity statin.  In 4 to 6 weeks a statin panel will be obtained. 4. Erectile dysfunction, possibly related to endothelial dysfunction.  This was theoretically discussed with the patient but not well understood.  Overall education and awareness concerning primary/secondary risk prevention was discussed in detail: LDL less than 70, hemoglobin A1c less than 7, blood pressure  target less than 130/80 mmHg, >150 minutes of moderate aerobic activity per week, avoidance of smoking, weight control (via diet and exercise), and continued surveillance/management of/for obstructive sleep apnea.  Target BP: <130/80 mmHg  Diet and lifestyle measures for BP control were reviewed in detail: Low sodium diet (<2.5 gm daily); alcohol restriction (<3 ounces per day); weight loss (Mediterranean); avoid non-steroidal agents; > 6 hours sleep per day; 150 min moderate exercise per week.  We will have follow-up discussion concerning erectile dysfunction on next office visit.  Needs liver and lipid panel at next office visit.  COVID-19 Education: The signs and symptoms of COVID-19 were discussed with the patient and how to seek care for testing (follow up with PCP or arrange E-visit).  The importance of social distancing was discussed today.  Time:   Today, I have spent 20 minutes with the patient with telehealth technology discussing  the above problems.     Medication Adjustments/Labs and Tests Ordered: Current medicines are reviewed at length with the patient today.  Concerns regarding medicines are outlined above.   Tests Ordered: No orders of the defined types were placed in this encounter.   Medication Changes: No orders of the defined types were placed in this encounter.   Disposition:  Follow up 4-6 weeks  Signed, Lesleigh Noe, MD  10/26/2018 9:54 AM    [facility]

## 2018-10-26 ENCOUNTER — Other Ambulatory Visit: Payer: Self-pay

## 2018-10-26 ENCOUNTER — Telehealth (INDEPENDENT_AMBULATORY_CARE_PROVIDER_SITE_OTHER): Payer: Self-pay | Admitting: Interventional Cardiology

## 2018-10-26 ENCOUNTER — Telehealth: Payer: Self-pay

## 2018-10-26 ENCOUNTER — Encounter: Payer: Self-pay | Admitting: Interventional Cardiology

## 2018-10-26 VITALS — Ht 68.0 in | Wt 178.0 lb

## 2018-10-26 DIAGNOSIS — I2510 Atherosclerotic heart disease of native coronary artery without angina pectoris: Secondary | ICD-10-CM

## 2018-10-26 DIAGNOSIS — R030 Elevated blood-pressure reading, without diagnosis of hypertension: Secondary | ICD-10-CM

## 2018-10-26 DIAGNOSIS — Z7189 Other specified counseling: Secondary | ICD-10-CM

## 2018-10-26 DIAGNOSIS — E785 Hyperlipidemia, unspecified: Secondary | ICD-10-CM

## 2018-10-26 DIAGNOSIS — N528 Other male erectile dysfunction: Secondary | ICD-10-CM

## 2018-10-26 NOTE — Patient Instructions (Signed)
Medication Instructions:  Your physician recommends that you continue on your current medications as directed. Please refer to the Current Medication list given to you today.  If you need a refill on your cardiac medications before your next appointment, please call your pharmacy.   Lab work: Your physician recommends that you return for lab work prior to seeing [place] back. You will need to be fasting for these labs (nothing to eat or drink after midnight except water and black coffee).  If you have labs (blood work) drawn today and your tests are completely normal, you will receive your results only by: Marland Kitchen MyChart Message (if you have MyChart) OR . A paper copy in the mail If you have any lab test that is abnormal or we need to change your treatment, we will call you to review the results.  Testing/Procedures: None  Follow-Up: At [facility], you and your health needs are our priority.  As part of our continuing mission to provide you with exceptional heart care, we have created designated Provider Care Teams.  These Care Teams include your primary Cardiologist (physician) and Advanced Practice Providers (APPs -  Physician Assistants and Nurse Practitioners) who all work together to provide you with the care you need, when you need it. You will need a follow up appointment in 4-6 weeks.  Please call our office 2 months in advance to schedule this appointment.  You may see Dr. Katrinka Blazing or one of the following Advanced Practice Providers on your designated Care Team:   Norma Fredrickson, NP Nada Boozer, NP . Georgie Chard, NP  Any Other Special Instructions Will Be Listed Below (If Applicable).

## 2018-10-26 NOTE — Telephone Encounter (Signed)
YOUR CARDIOLOGY TEAM HAS ARRANGED FOR AN E-VISIT FOR YOUR APPOINTMENT - PLEASE REVIEW IMPORTANT INFORMATION BELOW SEVERAL DAYS PRIOR TO YOUR APPOINTMENT  Due to the recent COVID-19 pandemic, we are transitioning in-person office visits to tele-medicine visits in an effort to decrease unnecessary exposure to our patients, their families, and staff. These visits are billed to your insurance just like a normal visit is. We also encourage you to sign up for MyChart if you have not already done so. You will need a smartphone if possible. For patients that do not have this, we can still complete the visit using a regular telephone but do prefer a smartphone to enable video when possible. You may have a family member that lives with you that can help. If possible, we also ask that you have a blood pressure cuff and scale at home to measure your blood pressure, heart rate and weight prior to your scheduled appointment. Patients with clinical needs that need an in-person evaluation and testing will still be able to come to the office if absolutely necessary. If you have any questions, feel free to call our office.     YOUR PROVIDER WILL BE USING THE FOLLOWING PLATFORM TO COMPLETE YOUR VISIT: Doximity  . IF USING MYCHART - How to Download the MyChart App to Your SmartPhone   - If Apple, go to App Store and type in MyChart in the search bar and download the app. If Android, ask patient to go to Google Play Store and type in MyChart in the search bar and download the app. The app is free but as with any other app downloads, your phone may require you to verify saved payment information or Apple/Android password.  - You will need to then log into the app with your MyChart username and password, and select [place] as your healthcare provider to link the account.  - When it is time for your visit, go to the MyChart app, find appointments, and click Begin Video Visit. Be sure to Select Allow for your device to  access the Microphone and Camera for your visit. You will then be connected, and your provider will be with you shortly.  **If you have any issues connecting or need assistance, please contact MyChart service desk [phone] ([phone])**  **If using a computer, in order to ensure the best quality for your visit, you will need to use either of the following Internet Browsers: Google Chrome or Microsoft Edge**  . IF USING DOXIMITY or [email or web address].ME - The staff will give you instructions on receiving your link to join the meeting the day of your visit.      2-3 DAYS BEFORE YOUR APPOINTMENT  You will receive a telephone call from one of our HeartCare team members - your caller ID may say "Unknown caller." If this is a video visit, we will walk you through how to get the video launched on your phone. We will remind you check your blood pressure, heart rate and weight prior to your scheduled appointment. If you have an Apple Watch or Kardia, please upload any pertinent ECG strips the day before or morning of your appointment to MyChart. Our staff will also make sure you have reviewed the consent and agree to move forward with your scheduled tele-health visit.     THE DAY OF YOUR APPOINTMENT  Approximately 15 minutes prior to your scheduled appointment, you will receive a telephone call from one of HeartCare team - your caller ID may say "Unknown caller."    Our staff will confirm medications, vital signs for the day and any symptoms you may be experiencing. Please have this information available prior to the time of visit start. It may also be helpful for you to have a pad of paper and pen handy for any instructions given during your visit. They will also walk you through joining the smartphone meeting if this is a video visit.    CONSENT FOR TELE-HEALTH VISIT - PLEASE REVIEW  I hereby voluntarily request, consent and authorize [facility] and its employed or contracted physicians, physician  assistants, nurse practitioners or other licensed health care professionals (the Practitioner), to provide me with telemedicine health care services (the "Services") as deemed necessary by the treating Practitioner. I acknowledge and consent to receive the Services by the Practitioner via telemedicine. I understand that the telemedicine visit will involve communicating with the Practitioner through live audiovisual communication technology and the disclosure of certain medical information by electronic transmission. I acknowledge that I have been given the opportunity to request an in-person assessment or other available alternative prior to the telemedicine visit and am voluntarily participating in the telemedicine visit.  I understand that I have the right to withhold or withdraw my consent to the use of telemedicine in the course of my care at any time, without affecting my right to future care or treatment, and that the Practitioner or I may terminate the telemedicine visit at any time. I understand that I have the right to inspect all information obtained and/or recorded in the course of the telemedicine visit and may receive copies of available information for a reasonable fee.  I understand that some of the potential risks of receiving the Services via telemedicine include:  . Delay or interruption in medical evaluation due to technological equipment failure or disruption; . Information transmitted may not be sufficient (e.g. poor resolution of images) to allow for appropriate medical decision making by the Practitioner; and/or  . In rare instances, security protocols could fail, causing a breach of personal health information.  Furthermore, I acknowledge that it is my responsibility to provide information about my medical history, conditions and care that is complete and accurate to the best of my ability. I acknowledge that Practitioner's advice, recommendations, and/or decision may be based on  factors not within their control, such as incomplete or inaccurate data provided by me or distortions of diagnostic images or specimens that may result from electronic transmissions. I understand that the practice of medicine is not an exact science and that Practitioner makes no warranties or guarantees regarding treatment outcomes. I acknowledge that I will receive a copy of this consent concurrently upon execution via email to the email address I last provided but may also request a printed copy by calling the office of [facility].    I understand that my insurance will be billed for this visit.   I have read or had this consent read to me. . I understand the contents of this consent, which adequately explains the benefits and risks of the Services being provided via telemedicine.  . I have been provided ample opportunity to ask questions regarding this consent and the Services and have had my questions answered to my satisfaction. . I give my informed consent for the services to be provided through the use of telemedicine in my medical care  By participating in this telemedicine visit I agree to the above.  

## 2018-11-01 ENCOUNTER — Telehealth: Payer: Self-pay

## 2018-11-01 NOTE — Telephone Encounter (Signed)
Called and spoke with patient for COVID 19 Screening. Patient had no risk factors and is cleared to come into office for appointment. Thanks! 

## 2018-11-02 ENCOUNTER — Encounter: Payer: Self-pay | Admitting: Family Medicine

## 2018-11-02 ENCOUNTER — Ambulatory Visit (INDEPENDENT_AMBULATORY_CARE_PROVIDER_SITE_OTHER): Payer: Self-pay | Admitting: Family Medicine

## 2018-11-02 ENCOUNTER — Other Ambulatory Visit: Payer: Self-pay

## 2018-11-02 VITALS — BP 129/77 | HR 63 | Temp 98.5°F | Resp 16 | Ht 66.0 in | Wt 186.0 lb

## 2018-11-02 DIAGNOSIS — I25110 Atherosclerotic heart disease of native coronary artery with unstable angina pectoris: Secondary | ICD-10-CM

## 2018-11-02 DIAGNOSIS — I214 Non-ST elevation (NSTEMI) myocardial infarction: Secondary | ICD-10-CM

## 2018-11-02 DIAGNOSIS — E7800 Pure hypercholesterolemia, unspecified: Secondary | ICD-10-CM

## 2018-11-02 NOTE — Patient Instructions (Signed)

## 2018-11-02 NOTE — Progress Notes (Signed)
[facility]  New Patient Encounter Provider: Mike Gipndre Eilene Voigt, FNP    ZOX:[id number]  WJX:[id number]  DOB - 03/10/1986  SUBJECTIVE:   Alex Ortiz, is a 33 y.o. male who presents to establish care with this clinic. He presented to the ED on 10/13/2018 with chest pain and elevated troponin. Troponin continued to increase.  Echo showed EF of 40-45% with akinesis of the apical, septal, inferior, anterior, lateral, inferolateral and apical segments. 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   He was placed on aggrastat for 36 hours prior to a drug-eluting stent. He was placed on metoprolol, atorvastatin, aspirin and plavix. Patient since followed up with heart care with Dr. Katrinka BlazingSmith. He reports compliance with medications and denies side effects at the present time. He states that he is having mild difficulty with sleeping due to the fear of having another MI. Today, he denies chest pain, SOB, dizziness or leg swelling. Last A1C was 4.5 while hospitalized. He does report a family history of CAD in his father.   No Known Allergies Past Medical History:  Diagnosis Date  . Asthma    Current Outpatient Medications on File Prior to Visit  Medication Sig Dispense Refill  . albuterol (VENTOLIN HFA) 108 (90 Base) MCG/ACT inhaler Inhale 2 puffs into the lungs every 6 (six) hours as needed for wheezing or shortness of breath. 1 Inhaler 2  . aspirin EC 81 MG EC tablet Take 1 tablet (81 mg total) by mouth daily. 30 tablet 2  . atorvastatin (LIPITOR) 80 MG tablet Take 1 tablet (80 mg total) by mouth daily at 6 PM. 90 tablet 1  . clopidogrel (PLAVIX) 75 MG tablet Take 1 tablet (75 mg total) by mouth daily with breakfast. 90 tablet 2  . metoprolol succinate (TOPROL-XL) 25 MG 24 hr tablet Take 1 tablet (25 mg total) by mouth daily. 90 tablet 1  . nitroGLYCERIN (NITROSTAT)  0.4 MG SL tablet Place 1 tablet (0.4 mg total) under the tongue every 5 (five) minutes as needed. 25 tablet 2   No current facility-administered medications on file prior to visit.    Family History  Problem Relation Age of Onset  . Hypertension Mother   . Heart disease Father        Cardiac bypass in his 8350s.   Social History   Socioeconomic History  . Marital status: Single    Spouse name: Not on file  . Number of children: Not on file  . Years of education: Not on file  . Highest education level: Not on file  Occupational History  . Not on file  Social Needs  . Financial resource strain: Not on file  . Food insecurity:    Worry: Not on file    Inability: Not on file  . Transportation needs:    Medical: Not on file    Non-medical: Not on file  Tobacco Use  . Smoking status: Current Every Day Smoker    Types: Cigarettes  . Smokeless tobacco: Never Used  . Tobacco comment: 2-3 cigarettes/day  Substance and Sexual Activity  . Alcohol use: Yes    Comment: socially   . Drug use: Yes    Types: Marijuana  . Sexual activity: Not on file  Lifestyle  . Physical activity:    Days per week: Not on file    Minutes per session: Not on file  . Stress: Not on file  Relationships  . Social connections:    Talks on phone: Not on file    Gets together: Not on file    Attends religious service: Not on file    Active member of club or organization: Not on file    Attends meetings of clubs or organizations: Not on file    Relationship status: Not on file  . Intimate partner violence:    Fear of current or ex partner: Not on file    Emotionally abused: Not on file    Physically abused: Not on file    Forced sexual activity: Not on file  Other Topics Concern  . Not on file  Social History Narrative  . Not on file    Review of Systems  Constitutional: Negative.   HENT: Negative.   Eyes: Negative.   Respiratory: Negative.   Cardiovascular: Negative.   Gastrointestinal:  Negative.   Genitourinary: Negative.   Musculoskeletal: Negative.   Skin: Negative.   Neurological: Negative.   Psychiatric/Behavioral: Negative.      OBJECTIVE:    BP 129/77 (BP Location: Left Arm, Patient Position: Sitting, Cuff Size: Normal)   Pulse 63   Temp 98.5 F (36.9 C) (Oral)   Resp 16   Ht 5\' 6"  (1.676 m)   Wt 186 lb (84.4 kg)   SpO2 100%   BMI 30.02 kg/m   Physical Exam  Constitutional: He is oriented to person, place, and time and well-developed, well-nourished, and in no distress. No distress.  HENT:  Head: Normocephalic and atraumatic.  Eyes: Pupils are equal, round, and reactive to light. Conjunctivae and EOM are normal.  Neck: Normal range of motion. Neck supple.  Cardiovascular: Normal rate, regular rhythm and intact distal pulses. Exam reveals no gallop and no friction rub.  No murmur heard. Pulmonary/Chest: Effort normal and breath sounds normal. No respiratory distress. He has no wheezes.  Abdominal: Soft. Bowel sounds are normal. There is no abdominal tenderness.  Musculoskeletal: Normal range of motion.        General: No tenderness or edema.  Lymphadenopathy:    He has no cervical adenopathy.  Neurological: He is alert and oriented to person, place, and time. Gait normal.  Skin: Skin is warm and dry.  Psychiatric: Mood, memory, affect and judgment normal.  Nursing note and vitals reviewed.    ASSESSMENT/PLAN:  1. Coronary artery disease involving native coronary artery of native heart with unstable angina pectoris (HCC)  2. Non-ST elevation (NSTEMI) myocardial infarction (HCC)  3. Pure hypercholesterolemia No medication changes warranted at the present time.   Patient is being followed by a specialist for these conditions. Patient advised to continue with follow up appointments. PCP will continue to monitor progress.   Advised patient on the role of the PCP.  Answered questions and explained that after a traumatic event, he can have  difficulty with sleep and every day activities. Discussed counseling/therapy.   Return in about 3 months (around 02/02/2019) for MI.  The patient was given clear instructions to go to ER or return to medical center if symptoms don't improve, worsen or new problems develop. The patient verbalized understanding. The patient was told to call to get lab results if they haven't heard anything in the next week.     This note has been created with Education officer, environmental. Any transcriptional errors are unintentional.   Ms. Andr L. Riley Lam, FNP-BC [facility] [address] [place], [place]  [address] [phone] 

## 2018-11-08 ENCOUNTER — Telehealth (HOSPITAL_COMMUNITY): Payer: Self-pay | Admitting: *Deleted

## 2018-11-21 ENCOUNTER — Other Ambulatory Visit: Payer: Self-pay

## 2018-11-21 ENCOUNTER — Telehealth: Payer: Self-pay

## 2018-11-21 MED ORDER — ASPIRIN 81 MG PO TBEC: 81.0000 mg | DELAYED_RELEASE_TABLET | Freq: Every day | ORAL | 2 refills | Status: DC

## 2018-11-21 MED ORDER — CLOPIDOGREL BISULFATE 75 MG PO TABS: 75.0000 mg | ORAL_TABLET | Freq: Every day | ORAL | 2 refills | Status: DC

## 2018-11-21 MED ORDER — METOPROLOL SUCCINATE ER 25 MG PO TB24: 25.0000 mg | ORAL_TABLET | Freq: Every day | ORAL | 1 refills | Status: DC

## 2018-11-21 NOTE — Telephone Encounter (Signed)
Refills sent in to pharmacy 

## 2018-11-28 ENCOUNTER — Telehealth (HOSPITAL_COMMUNITY): Payer: Self-pay

## 2018-11-28 NOTE — Telephone Encounter (Signed)
No response from pt, closed referral. °

## 2018-12-06 NOTE — Progress Notes (Signed)
Cardiology Office Note:    Date:  12/08/2018   ID:  Alex Ortiz, DOB [birth year], MRN [id number]  PCP:  Lanae Boast, FNP  Cardiologist:  Sinclair Grooms, MD   Referring MD: No ref. provider found   Chief Complaint  Patient presents with  . Coronary Artery Disease    History of Present Illness:    Alex Ortiz is a 33 y.o. male with a hx of recent anterior NSTEMI treated with DES. 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  Has intermittent twinges of chest discomfort in the left subclavicular area that has been concerning him.  These episodes occur at random and last less than 5 seconds.  Very low intensity but enough to concern him.  No exertional discomfort.  Has been more sedentary and mostly playing video games.  He is not working.  He does have means to purchase his medication.  Past Medical History:  Diagnosis Date  . Asthma     Past Surgical History:  Procedure Laterality Date  . CORONARY STENT INTERVENTION N/A 10/18/2018   Procedure: CORONARY STENT INTERVENTION;  Surgeon: Leonie Man, MD;  Location: [facility];  Service: Cardiovascular;  Laterality: N/A;  . HERNIA REPAIR    . LEFT HEART CATH AND CORONARY ANGIOGRAPHY N/A 10/16/2018   Procedure: LEFT HEART CATH AND CORONARY ANGIOGRAPHY;  Surgeon: Lorretta Harp, MD;  Location: [facility];  Service: Cardiovascular;  Laterality: N/A;    Current Medications: Current Meds  Medication Sig  . albuterol (VENTOLIN HFA) 108 (90 Base) MCG/ACT inhaler Inhale 2 puffs into the lungs every 6 (six) hours as needed for wheezing or shortness of breath.  Marland Kitchen aspirin 81 MG EC tablet Take 1 tablet (81 mg total) by mouth daily.  Marland Kitchen atorvastatin (LIPITOR) 80 MG tablet Take 1 tablet (80 mg total) by mouth daily at 6 PM.  . clopidogrel (PLAVIX) 75 MG tablet Take 1 tablet (75 mg total) by mouth daily with breakfast.  . metoprolol succinate (TOPROL-XL)  25 MG 24 hr tablet Take 0.5 tablets (12.5 mg total) by mouth daily.  . nitroGLYCERIN (NITROSTAT) 0.4 MG SL tablet Place 1 tablet (0.4 mg total) under the tongue every 5 (five) minutes as needed.  . [DISCONTINUED] metoprolol succinate (TOPROL-XL) 25 MG 24 hr tablet Take 1 tablet (25 mg total) by mouth daily.     Allergies:   Patient has no known allergies.   Social History   Socioeconomic History  . Marital status: Single    Spouse name: Not on file  . Number of children: Not on file  . Years of education: Not on file  . Highest education level: Not on file  Occupational History  . Not on file  Social Needs  . Financial resource strain: Not on file  . Food insecurity    Worry: Not on file    Inability: Not on file  . Transportation needs    Medical: Not on file    Non-medical: Not on file  Tobacco Use  . Smoking status: Current Every Day Smoker    Types: Cigarettes  . Smokeless tobacco: Never Used  . Tobacco comment: 2-3 cigarettes/day  Substance and Sexual Activity  . Alcohol use: Yes    Comment: socially   . Drug use: Yes    Types: Marijuana  . Sexual activity: Not on file  Lifestyle  . Physical activity    Days per week: Not on file  Minutes per session: Not on file  . Stress: Not on file  Relationships  . Social Musicianconnections    Talks on phone: Not on file    Gets together: Not on file    Attends religious service: Not on file    Active member of club or organization: Not on file    Attends meetings of clubs or organizations: Not on file    Relationship status: Not on file  Other Topics Concern  . Not on file  Social History Narrative  . Not on file     Family History: The patient's family history includes Heart disease in his father; Hypertension in his mother.  ROS:   Please see the history of present illness.    Feels out of energy in the heat.  Sleeps a lot.  Feels he has no energy.  All other systems reviewed and are negative.  EKGs/Labs/Other  Studies Reviewed:    The following studies were reviewed today: LP(a) 79.5 (normal range less than 75)  EKG:  EKG No new data  Recent Labs: 10/19/2018: BUN 9; Creatinine, Ser 1.23; Hemoglobin 13.6; Platelets 394; Potassium 3.7; Sodium 137 12/07/2018: ALT 55  Recent Lipid Panel    Component Value Date/Time   CHOL 98 (L) 12/07/2018 0901   TRIG 127 12/07/2018 0901   HDL 43 12/07/2018 0901   CHOLHDL 2.3 12/07/2018 0901   CHOLHDL 4.1 10/14/2018 0205   VLDL 14 10/14/2018 0205   LDLCALC 30 12/07/2018 0901    Physical Exam:    VS:  BP 118/76   Pulse 69   Ht 5\' 6"  (1.676 m)   Wt 187 lb 12.8 oz (85.2 kg)   SpO2 98%   BMI 30.31 kg/m     Wt Readings from Last 3 Encounters:  12/08/18 187 lb 12.8 oz (85.2 kg)  11/02/18 186 lb (84.4 kg)  10/26/18 178 lb (80.7 kg)     GEN: Healthy-appearing. No acute distress HEENT: Normal NECK: No JVD. LYMPHATICS: No lymphadenopathy CARDIAC:  RRR without murmur, gallop, or edema. VASCULAR:  Normal Pulses. No bruits. RESPIRATORY:  Clear to auscultation without rales, wheezing or rhonchi  ABDOMEN: Soft, non-tender, non-distended, No pulsatile mass, MUSCULOSKELETAL: No deformity  SKIN: Warm and dry NEUROLOGIC:  Alert and oriented x 3 PSYCHIATRIC:  Normal affect   ASSESSMENT:    1. Atherosclerosis of native coronary artery of native heart without angina pectoris   2. Elevated BP without diagnosis of hypertension   3. Hyperlipidemia LDL goal <70   4. Malaise    PLAN:    In order of problems listed above:  1. Secondary prevention discussed in detail.  Encouraged that he liberalize physical activity incorporating at least 100 to 300 minutes of moderate aerobic activity per week.  Avoid heavy isometric activity.  He is stop smoking.  His diet has changed to become more plant-based with non-fried seafood. 2. Blood pressure target 130/80 mmHg. 3. LDL target less than 70.  On admission during his infarction LDL was 127. 4. Likely multifactorial  including the possibility of some situational depression.  Beta-blocker could also be influencing his mood and energy.  Decrease Toprol-XL to 12.5 mg/day.  Recheck blood pressure on return to make sure we are achieving adequate control.  Overall education and awareness concerning primary/secondary risk prevention was discussed in detail: LDL less than 70, hemoglobin A1c less than 7, blood pressure target less than 130/80 mmHg, >150 minutes of moderate aerobic activity per week, avoidance of smoking, weight control (via diet and  exercise), and continued surveillance/management of/for obstructive sleep apnea.    Medication Adjustments/Labs and Tests Ordered: Current medicines are reviewed at length with the patient today.  Concerns regarding medicines are outlined above.  No orders of the defined types were placed in this encounter.  Meds ordered this encounter  Medications  . metoprolol succinate (TOPROL-XL) 25 MG 24 hr tablet    Sig: Take 0.5 tablets (12.5 mg total) by mouth daily.    Dispense:  45 tablet    Refill:  1    Dose change    There are no Patient Instructions on file for this visit.   Signed, Lesleigh NoeHenry W Yishai Rehfeld III, MD  12/08/2018 11:06 AM    [facility]

## 2018-12-07 ENCOUNTER — Other Ambulatory Visit: Payer: Self-pay | Admitting: *Deleted

## 2018-12-07 ENCOUNTER — Encounter: Payer: Self-pay | Admitting: Interventional Cardiology

## 2018-12-07 ENCOUNTER — Telehealth: Payer: Self-pay | Admitting: Interventional Cardiology

## 2018-12-07 ENCOUNTER — Other Ambulatory Visit: Payer: Self-pay

## 2018-12-07 DIAGNOSIS — E785 Hyperlipidemia, unspecified: Secondary | ICD-10-CM

## 2018-12-07 DIAGNOSIS — I2510 Atherosclerotic heart disease of native coronary artery without angina pectoris: Secondary | ICD-10-CM

## 2018-12-07 LAB — LIPID PANEL
Chol/HDL Ratio: 2.3 ratio (ref 0.0–5.0)
Cholesterol, Total: 98 mg/dL — ABNORMAL LOW (ref 100–199)
HDL: 43 mg/dL (ref >39)
LDL Calculated: 30 mg/dL (ref 0–99)
Triglycerides: 127 mg/dL (ref 0–149)
VLDL Cholesterol Cal: 25 mg/dL (ref 5–40)

## 2018-12-07 LAB — HEPATIC FUNCTION PANEL
ALT: 55 IU/L — ABNORMAL HIGH (ref 0–44)
AST: 32 IU/L (ref 0–40)
Albumin: 4.5 g/dL (ref 4.0–5.0)
Alkaline Phosphatase: 85 IU/L (ref 39–117)
Bilirubin Total: 0.5 mg/dL (ref 0.0–1.2)
Bilirubin, Direct: 0.17 mg/dL (ref 0.00–0.40)
Total Protein: 6.4 g/dL (ref 6.0–8.5)

## 2018-12-07 NOTE — Telephone Encounter (Signed)

## 2018-12-08 ENCOUNTER — Ambulatory Visit (INDEPENDENT_AMBULATORY_CARE_PROVIDER_SITE_OTHER): Payer: Self-pay | Admitting: Interventional Cardiology

## 2018-12-08 ENCOUNTER — Encounter: Payer: Self-pay | Admitting: Interventional Cardiology

## 2018-12-08 VITALS — BP 118/76 | HR 69 | Ht 66.0 in | Wt 187.8 lb

## 2018-12-08 DIAGNOSIS — R5381 Other malaise: Secondary | ICD-10-CM

## 2018-12-08 DIAGNOSIS — E785 Hyperlipidemia, unspecified: Secondary | ICD-10-CM

## 2018-12-08 DIAGNOSIS — I2510 Atherosclerotic heart disease of native coronary artery without angina pectoris: Secondary | ICD-10-CM

## 2018-12-08 DIAGNOSIS — R030 Elevated blood-pressure reading, without diagnosis of hypertension: Secondary | ICD-10-CM

## 2018-12-08 MED ORDER — METOPROLOL SUCCINATE ER 25 MG PO TB24: 12.5000 mg | ORAL_TABLET | Freq: Every day | ORAL | 1 refills | Status: DC

## 2018-12-08 NOTE — Patient Instructions (Signed)
Medication Instructions:  1) DECREASE Metoprolol Succinate to 12.5mg  once daily  If you need a refill on your cardiac medications before your next appointment, please call your pharmacy.   Lab work: You will have fasting labs at your next appointment. Nothing to eat or drink after midnight except water and black coffee.  If you have labs (blood work) drawn today and your tests are completely normal, you will receive your results only by: Marland Kitchen MyChart Message (if you have MyChart) OR . A paper copy in the mail If you have any lab test that is abnormal or we need to change your treatment, we will call you to review the results.  Testing/Procedures: None  Follow-Up: At [facility], you and your health needs are our priority.  As part of our continuing mission to provide you with exceptional heart care, we have created designated Provider Care Teams.  These Care Teams include your primary Cardiologist (physician) and Advanced Practice Providers (APPs -  Physician Assistants and Nurse Practitioners) who all work together to provide you with the care you need, when you need it. You will need a follow up appointment in 4 months.  Please call our office 2 months in advance to schedule this appointment.  You may see Sinclair Grooms, MD or one of the following Advanced Practice Providers on your designated Care Team:   Truitt Merle, NP Cecilie Kicks, NP . Kathyrn Drown, NP  Any Other Special Instructions Will Be Listed Below (If Applicable).

## 2019-02-02 ENCOUNTER — Other Ambulatory Visit: Payer: Self-pay

## 2019-02-02 ENCOUNTER — Encounter: Payer: Self-pay | Admitting: Family Medicine

## 2019-02-02 ENCOUNTER — Other Ambulatory Visit: Payer: Self-pay | Admitting: Family Medicine

## 2019-02-02 ENCOUNTER — Ambulatory Visit (INDEPENDENT_AMBULATORY_CARE_PROVIDER_SITE_OTHER): Payer: Self-pay | Admitting: Family Medicine

## 2019-02-02 VITALS — BP 132/71 | HR 81 | Temp 98.2°F | Resp 14 | Ht 66.0 in | Wt 185.0 lb

## 2019-02-02 DIAGNOSIS — E7800 Pure hypercholesterolemia, unspecified: Secondary | ICD-10-CM

## 2019-02-02 DIAGNOSIS — I214 Non-ST elevation (NSTEMI) myocardial infarction: Secondary | ICD-10-CM

## 2019-02-02 DIAGNOSIS — J4520 Mild intermittent asthma, uncomplicated: Secondary | ICD-10-CM

## 2019-02-02 DIAGNOSIS — R12 Heartburn: Secondary | ICD-10-CM

## 2019-02-02 MED ORDER — FAMOTIDINE 40 MG PO TABS: 40.0000 mg | ORAL_TABLET | Freq: Every day | ORAL | 0 refills | Status: DC

## 2019-02-02 MED ORDER — ALBUTEROL SULFATE HFA 108 (90 BASE) MCG/ACT IN AERS: 2.0000 | INHALATION_SPRAY | Freq: Four times a day (QID) | RESPIRATORY_TRACT | 2 refills | Status: DC | PRN

## 2019-02-02 NOTE — Progress Notes (Signed)
[facility]   Progress Note: General Provider: Mike GipAndre Angelli Baruch, FNP  SUBJECTIVE:   Alex Ortiz is a 33 y.o. male who  has a past medical history of Asthma.. Patient presents today for Follow-up (3 month follow up for MI )  Patient is followed by Dr. Katrinka BlazingSmith in [facility]. At the last visit, Alex Ortiz was to decrease the metoprolol to 12.5 mg daily. He states that he is doing well today. Has intermittent chest discomfort sometimes that is worsened with eating or bending. He experiences belching with this pain.  He has a history of asthma. Does not use the albuterol often. He does not endorse feelings of sadness, lack of motivation, or suicidal ideation. He does report being fearful that he could experience a MI in his sleep. He does not currently want treatment for depression or anxiety.    Review of Systems  Constitutional: Negative.   HENT: Negative.   Eyes: Negative.   Respiratory: Negative.   Cardiovascular: Negative.   Gastrointestinal: Negative.   Genitourinary: Negative.   Musculoskeletal: Negative.   Skin: Negative.   Neurological: Negative.   Psychiatric/Behavioral: Negative.      OBJECTIVE: BP 132/71 (BP Location: Left Arm, Patient Position: Sitting, Cuff Size: Normal)   Pulse 81   Temp 98.2 F (36.8 C) (Oral)   Resp 14   Ht 5\' 6"  (1.676 m)   Wt 185 lb (83.9 kg)   SpO2 99%   BMI 29.86 kg/m   Wt Readings from Last 3 Encounters:  02/02/19 185 lb (83.9 kg)  12/08/18 187 lb 12.8 oz (85.2 kg)  11/02/18 186 lb (84.4 kg)     Physical Exam Vitals signs and nursing note reviewed.  Constitutional:      General: He is not in acute distress.    Appearance: Normal appearance.  HENT:     Head: Normocephalic and atraumatic.  Eyes:     Extraocular Movements: Extraocular movements intact.     Conjunctiva/sclera: Conjunctivae normal.     Pupils: Pupils are equal, round, and reactive to light.  Cardiovascular:     Rate and  Rhythm: Normal rate and regular rhythm.     Heart sounds: No murmur.  Pulmonary:     Effort: Pulmonary effort is normal.     Breath sounds: Normal breath sounds.  Musculoskeletal: Normal range of motion.  Skin:    General: Skin is warm and dry.  Neurological:     Mental Status: He is alert and oriented to person, place, and time.  Psychiatric:        Mood and Affect: Mood normal.        Behavior: Behavior normal.        Thought Content: Thought content normal.        Judgment: Judgment normal.     ASSESSMENT/PLAN:   1. Mild intermittent asthma, unspecified whether complicated - albuterol (VENTOLIN HFA) 108 (90 Base) MCG/ACT inhaler; Inhale 2 puffs into the lungs every 6 (six) hours as needed for wheezing or shortness of breath.  Dispense: 6.7 g; Refill: 2  2. Heartburn - famotidine (PEPCID) 40 MG tablet; Take 1 tablet (40 mg total) by mouth daily.  Dispense: 30 tablet; Refill: 0  3. Pure hypercholesterolemia  4. Non-ST elevation (NSTEMI) myocardial infarction [facility](HCC) Discussed that some people do experience anxiety after a major life event such as a MI. Gave patient resources for therapists in the area .    Return in about 6 months (around 08/02/2019)  for asthma.    The patient was given clear instructions to go to ER or return to medical center if symptoms do not improve, worsen or new problems develop. The patient verbalized understanding and agreed with plan of care.   Ms. Doug Sou. Nathaneil Canary, FNP-BC [facility] [address] [place], [place] [address] [phone]

## 2019-02-02 NOTE — Patient Instructions (Addendum)
[email or web address] This is a sight that can help you connect with other [facility] and therapists in the area.  You've been through a lot and sometimes it is good to have someone to talk to.     Health Maintenance, Male Adopting a healthy lifestyle and getting preventive care are important in promoting health and wellness. Ask your health care provider about:  The right schedule for you to have regular tests and exams.  Things you can do on your own to prevent diseases and keep yourself healthy. What should I know about diet, weight, and exercise? Eat a healthy diet   Eat a diet that includes plenty of vegetables, fruits, low-fat dairy products, and lean protein.  Do not eat a lot of foods that are high in solid fats, added sugars, or sodium. Maintain a healthy weight Body mass index (BMI) is a measurement that can be used to identify possible weight problems. It estimates body fat based on height and weight. Your health care provider can help determine your BMI and help you achieve or maintain a healthy weight. Get regular exercise Get regular exercise. This is one of the most important things you can do for your health. Most adults should:  Exercise for at least 150 minutes each week. The exercise should increase your heart rate and make you sweat (moderate-intensity exercise).  Do strengthening exercises at least twice a week. This is in addition to the moderate-intensity exercise.  Spend less time sitting. Even light physical activity can be beneficial. Watch cholesterol and blood lipids Have your blood tested for lipids and cholesterol at 32 years of age, then have this test every 5 years. You may need to have your cholesterol levels checked more often if:  Your lipid or cholesterol levels are high.  You are older than 32 years of age.  You are at high risk for heart disease. What should I know about cancer screening? Many types of cancers can be detected  early and may often be prevented. Depending on your health history and family history, you may need to have cancer screening at various ages. This may include screening for:  Colorectal cancer.  Prostate cancer.  Skin cancer.  Lung cancer. What should I know about heart disease, diabetes, and high blood pressure? Blood pressure and heart disease  High blood pressure causes heart disease and increases the risk of stroke. This is more likely to develop in people who have high blood pressure readings, are of African descent, or are overweight.  Talk with your health care provider about your target blood pressure readings.  Have your blood pressure checked: ? Every 3-5 years if you are 8218-32 years of age. ? Every year if you are 32 years old or older.  If you are between the ages of 2065 and 4875 and are a current or former smoker, ask your health care provider if you should have a one-time screening for abdominal aortic aneurysm (AAA). Diabetes Have regular diabetes screenings. This checks your fasting blood sugar level. Have the screening done:  Once every three years after age 32 if you are at a normal weight and have a low risk for diabetes.  More often and at a younger age if you are overweight or have a high risk for diabetes. What should I know about preventing infection? Hepatitis B If you have a higher risk for hepatitis B, you should be screened for this virus. Talk with your health care provider to find out if you are  at risk for hepatitis B infection. Hepatitis C Blood testing is recommended for:  Everyone born from [birth year] through 1964.  Anyone with known risk factors for hepatitis C. Sexually transmitted infections (STIs)  You should be screened each year for STIs, including gonorrhea and chlamydia, if: ? You are sexually active and are younger than 32 years of age. ? You are older than 33 years of age and your health care provider tells you that you are at risk for this  type of infection. ? Your sexual activity has changed since you were last screened, and you are at increased risk for chlamydia or gonorrhea. Ask your health care provider if you are at risk.  Ask your health care provider about whether you are at high risk for HIV. Your health care provider may recommend a prescription medicine to help prevent HIV infection. If you choose to take medicine to prevent HIV, you should first get tested for HIV. You should then be tested every 3 months for as long as you are taking the medicine. Follow these instructions at home: Lifestyle  Do not use any products that contain nicotine or tobacco, such as cigarettes, e-cigarettes, and chewing tobacco. If you need help quitting, ask your health care provider.  Do not use street drugs.  Do not share needles.  Ask your health care provider for help if you need support or information about quitting drugs. Alcohol use  Do not drink alcohol if your health care provider tells you not to drink.  If you drink alcohol: ? Limit how much you have to 0-2 drinks a day. ? Be aware of how much alcohol is in your drink. In the [place] one drink equals one 12 oz bottle of beer (355 mL), one 5 oz glass of wine (148 mL), or one 1 oz glass of hard liquor (44 mL). General instructions  Schedule regular health, dental, and eye exams.  Stay current with your vaccines.  Tell your health care provider if: ? You often feel depressed. ? You have ever been abused or do not feel safe at home. Summary  Adopting a healthy lifestyle and getting preventive care are important in promoting health and wellness.  Follow your health care provider's instructions about healthy diet, exercising, and getting tested or screened for diseases.  Follow your health care provider's instructions on monitoring your cholesterol and blood pressure. This information is not intended to replace advice given to you by your health care provider. Make sure  you discuss any questions you have with your health care provider. Document Released: 11/13/2007 Document Revised: 05/10/2018 Document Reviewed: 05/10/2018 Elsevier Patient Education  2020 [employer].

## 2019-02-07 ENCOUNTER — Encounter (HOSPITAL_COMMUNITY): Payer: Self-pay | Admitting: *Deleted

## 2019-02-07 ENCOUNTER — Encounter (HOSPITAL_COMMUNITY): Payer: Self-pay

## 2019-04-14 NOTE — Progress Notes (Signed)
Cardiology Office Note:    Date:  04/16/2019   ID:  Alex Ortiz, DOB 01-24-1986, MRN [id number]  PCP:  Lanae Boast, FNP  Cardiologist:  Sinclair Grooms, MD   Referring MD: Lanae Boast, [place]   Chief Complaint  Patient presents with  . Coronary Artery Disease    History of Present Illness:    Alex Ortiz is a 33 y.o. male with a hx of anterior NSTEMI treated with DES. 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  He is doing relatively well.  He takes taekwondo 3 days/week, spending 60 minutes with moderate physical activity.  He is otherwise relatively sedentary.  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  He is occasionally had subclavicular and precordial tightness in his chest.  This does not occur doing taekwondo but rather randomly at rest.  He denies orthopnea, PND, exertional dyspnea, and exertional angina.  He does smoke weed.   Past Medical History:  Diagnosis Date  . Asthma     Past Surgical History:  Procedure Laterality Date  . CORONARY STENT INTERVENTION N/A 10/18/2018   Procedure: CORONARY STENT INTERVENTION;  Surgeon: Leonie Man, MD;  Location: [facility];  Service: Cardiovascular;  Laterality: N/A;  . HERNIA REPAIR    . LEFT HEART CATH AND CORONARY ANGIOGRAPHY N/A 10/16/2018   Procedure: LEFT HEART CATH AND CORONARY ANGIOGRAPHY;  Surgeon: Lorretta Harp, MD;  Location: [facility];  Service: Cardiovascular;  Laterality: N/A;    Current Medications: Current Meds  Medication Sig  . albuterol (VENTOLIN HFA) 108 (90 Base) MCG/ACT inhaler Inhale 2 puffs into the lungs every 6 (six) hours as needed for wheezing or shortness of breath.  Marland Kitchen atorvastatin (LIPITOR) 80 MG tablet Take 1 tablet (80 mg total) by mouth daily at 6 PM.  . clopidogrel (PLAVIX) 75 MG tablet Take 1 tablet (75 mg total) by mouth daily with breakfast.  .  metoprolol succinate (TOPROL-XL) 25 MG 24 hr tablet Take 0.5 tablets (12.5 mg total) by mouth daily.  . nitroGLYCERIN (NITROSTAT) 0.4 MG SL tablet Place 1 tablet (0.4 mg total) under the tongue every 5 (five) minutes as needed.     Allergies:   Bc powder [aspirin-salicylamide-caffeine] and Tangerine flavor   Social History   Socioeconomic History  . Marital status: Single    Spouse name: Not on file  . Number of children: Not on file  . Years of education: Not on file  . Highest education level: Not on file  Occupational History  . Not on file  Social Needs  . Financial resource strain: Not on file  . Food insecurity    Worry: Not on file    Inability: Not on file  . Transportation needs    Medical: Not on file    Non-medical: Not on file  Tobacco Use  . Smoking status: Current Every Day Smoker    Types: Cigarettes  . Smokeless tobacco: Never Used  . Tobacco comment: 2-3 cigarettes/day  Substance and Sexual Activity  . Alcohol use: Yes    Comment: socially   . Drug use: Yes    Types: Marijuana  . Sexual activity: Not on file  Lifestyle  . Physical activity    Days per week: Not on file    Minutes per session: Not on file  . Stress: Not on file  Relationships  . Social  connections    Talks on phone: Not on file    Gets together: Not on file    Attends religious service: Not on file    Active member of club or organization: Not on file    Attends meetings of clubs or organizations: Not on file    Relationship status: Not on file  Other Topics Concern  . Not on file  Social History Narrative  . Not on file     Family History: The patient's family history includes Heart disease in his father; Hypertension in his mother.  ROS:   Please see the history of present illness.    Anxiety, occasional shortness of breath related to "asthma".  He has in the past been prescribed an albuterol inhaler.  He dropped aspirin from his regimen.  All other systems reviewed and are  negative.  EKGs/Labs/Other Studies Reviewed:    The following studies were reviewed today: Coronary Angiography 09/2018: Diagnostic Dominance: Right   Intervention       EKG:  EKG no tracing performed today  Recent Labs: 10/19/2018: BUN 9; Creatinine, Ser 1.23; Hemoglobin 13.6; Platelets 394; Potassium 3.7; Sodium 137 12/07/2018: ALT 55  Recent Lipid Panel    Component Value Date/Time   CHOL 98 (L) 12/07/2018 0901   TRIG 127 12/07/2018 0901   HDL 43 12/07/2018 0901   CHOLHDL 2.3 12/07/2018 0901   CHOLHDL 4.1 10/14/2018 0205   VLDL 14 10/14/2018 0205   LDLCALC 30 12/07/2018 0901    Physical Exam:    VS:  BP 128/78   Pulse 69   Ht 5\' 6"  (1.676 m)   Wt 192 lb 8 oz (87.3 kg)   SpO2 97%   BMI 31.07 kg/m     Wt Readings from Last 3 Encounters:  04/16/19 192 lb 8 oz (87.3 kg)  02/02/19 185 lb (83.9 kg)  12/08/18 187 lb 12.8 oz (85.2 kg)     GEN: Young and otherwise healthy appearing. No acute distress HEENT: Normal NECK: No JVD. LYMPHATICS: No lymphadenopathy CARDIAC:  RRR without murmur, gallop, or edema. VASCULAR:  Normal Pulses. No bruits. RESPIRATORY:  Clear to auscultation without rales, wheezing or rhonchi  ABDOMEN: Soft, non-tender, non-distended, No pulsatile mass, MUSCULOSKELETAL: No deformity  SKIN: Warm and dry NEUROLOGIC:  Alert and oriented x 3 PSYCHIATRIC:  Normal affect   ASSESSMENT:    1. Atherosclerosis of native coronary artery of native heart without angina pectoris   2. Hyperlipidemia LDL goal <70   3. Other male erectile dysfunction   4. Educated about COVID-19 virus infection    PLAN:    In order of problems listed above:  1. Secondary prevention discussed.  The importance of aerobic activity discussed.  Abstaining from smoking of any type is stressed. 2. Target LDL less than 70.  Documented to be 30 in July.  He is on high intensity statin therapy. 3. Not discussed 4. Social distancing, mask wearing, and handwashing is stressed.   Overall education and awareness concerning primary/secondary risk prevention was discussed in detail: LDL less than 70, hemoglobin A1c less than 7, blood pressure target less than 130/80 mmHg, >150 minutes of moderate aerobic activity per week, avoidance of smoking, weight control (via diet and exercise), and continued surveillance/management of/for obstructive sleep apnea.  Recent home enteric-coated aspirin 81 mg/day.   Medication Adjustments/Labs and Tests Ordered: Current medicines are reviewed at length with the patient today.  Concerns regarding medicines are outlined above.  No orders of the defined types were placed  in this encounter.  No orders of the defined types were placed in this encounter.   There are no Patient Instructions on file for this visit.   Signed, Lesleigh NoeHenry W Smith III, MD  04/16/2019 8:14 AM    [facility]

## 2019-04-16 ENCOUNTER — Encounter: Payer: Self-pay | Admitting: Interventional Cardiology

## 2019-04-16 ENCOUNTER — Ambulatory Visit (INDEPENDENT_AMBULATORY_CARE_PROVIDER_SITE_OTHER): Payer: Self-pay | Admitting: Interventional Cardiology

## 2019-04-16 ENCOUNTER — Other Ambulatory Visit: Payer: Self-pay

## 2019-04-16 VITALS — BP 128/78 | HR 69 | Ht 66.0 in | Wt 192.5 lb

## 2019-04-16 DIAGNOSIS — E785 Hyperlipidemia, unspecified: Secondary | ICD-10-CM

## 2019-04-16 DIAGNOSIS — I2510 Atherosclerotic heart disease of native coronary artery without angina pectoris: Secondary | ICD-10-CM

## 2019-04-16 DIAGNOSIS — N528 Other male erectile dysfunction: Secondary | ICD-10-CM

## 2019-04-16 DIAGNOSIS — Z7189 Other specified counseling: Secondary | ICD-10-CM

## 2019-04-16 DIAGNOSIS — J4520 Mild intermittent asthma, uncomplicated: Secondary | ICD-10-CM

## 2019-04-16 MED ORDER — ALBUTEROL SULFATE HFA 108 (90 BASE) MCG/ACT IN AERS: 2.0000 | INHALATION_SPRAY | Freq: Four times a day (QID) | RESPIRATORY_TRACT | 0 refills | Status: AC | PRN

## 2019-04-16 MED ORDER — ASPIRIN EC 81 MG PO TBEC: 81.0000 mg | DELAYED_RELEASE_TABLET | Freq: Every day | ORAL | 3 refills | Status: DC

## 2019-04-16 NOTE — Patient Instructions (Signed)
Medication Instructions:  1) START Aspirin 81mg  once daily  *If you need a refill on your cardiac medications before your next appointment, please call your pharmacy*  Lab Work: None If you have labs (blood work) drawn today and your tests are completely normal, you will receive your results only by: Marland Kitchen MyChart Message (if you have MyChart) OR . A paper copy in the mail If you have any lab test that is abnormal or we need to change your treatment, we will call you to review the results.  Testing/Procedures: None  Follow-Up: At [facility], you and your health needs are our priority.  As part of our continuing mission to provide you with exceptional heart care, we have created designated Provider Care Teams.  These Care Teams include your primary Cardiologist (physician) and Advanced Practice Providers (APPs -  Physician Assistants and Nurse Practitioners) who all work together to provide you with the care you need, when you need it.  Your next appointment:   6 months  The format for your next appointment:   In Person  Provider:   You may see Sinclair Grooms, MD or one of the following Advanced Practice Providers on your designated Care Team:    Truitt Merle, NP  Cecilie Kicks, NP  Kathyrn Drown, NP   Other Instructions

## 2019-05-04 ENCOUNTER — Ambulatory Visit: Payer: Self-pay | Admitting: Family Medicine

## 2019-07-04 ENCOUNTER — Other Ambulatory Visit: Payer: Self-pay | Admitting: Cardiology

## 2019-07-17 ENCOUNTER — Ambulatory Visit (INDEPENDENT_AMBULATORY_CARE_PROVIDER_SITE_OTHER): Payer: Self-pay | Admitting: Family Medicine

## 2019-07-17 ENCOUNTER — Other Ambulatory Visit: Payer: Self-pay

## 2019-07-17 ENCOUNTER — Encounter: Payer: Self-pay | Admitting: Family Medicine

## 2019-07-17 VITALS — BP 122/63 | HR 63 | Temp 98.6°F | Ht 68.0 in | Wt 192.4 lb

## 2019-07-17 DIAGNOSIS — I25110 Atherosclerotic heart disease of native coronary artery with unstable angina pectoris: Secondary | ICD-10-CM

## 2019-07-17 DIAGNOSIS — F1290 Cannabis use, unspecified, uncomplicated: Secondary | ICD-10-CM

## 2019-07-17 DIAGNOSIS — J4520 Mild intermittent asthma, uncomplicated: Secondary | ICD-10-CM

## 2019-07-17 DIAGNOSIS — Z0000 Encounter for general adult medical examination without abnormal findings: Secondary | ICD-10-CM

## 2019-07-17 LAB — POCT URINALYSIS DIPSTICK
Bilirubin, UA: NEGATIVE
Blood, UA: NEGATIVE
Glucose, UA: NEGATIVE
Ketones, UA: NEGATIVE
Leukocytes, UA: NEGATIVE
Nitrite, UA: NEGATIVE
Protein, UA: NEGATIVE
Spec Grav, UA: >=1.03 — AB (ref 1.010–1.025)
Urobilinogen, UA: 0.2 E.U./dL
pH, UA: 6 (ref 5.0–8.0)

## 2019-07-17 LAB — POCT GLYCOSYLATED HEMOGLOBIN (HGB A1C): Hemoglobin A1C: 4.7 % (ref 4.0–5.6)

## 2019-07-17 LAB — GLUCOSE, POCT (MANUAL RESULT ENTRY): POC Glucose: 90 mg/dl (ref 70–99)

## 2019-07-17 NOTE — Progress Notes (Signed)
Established Patient Office Visit  Subjective:  Patient ID: Alex Ortiz, male    DOB: 13-Sep-1985  Age: 34 y.o. MRN: [id number]  CC:  Chief Complaint  Patient presents with  . Follow-up    CAD, Asthma    [facility]  Alex Ortiz, a 34 year old male with a medical history significant for mild intermittent asthma, left hear cath and coronary angiography, myocardial infarction, and chronic marijuana use presents for a follow up of chronic conditions. Patient says that he is doing very well and is without complaint. He is under the care of cardiology and has an upcoming appointment. He says that he has been taking all prescribed medications consistently. He continues to smoke marijuana daily. He says that he smokes "2-3 joints per day".   Asthma There is no chest tightness, cough, difficulty breathing, shortness of breath, sputum production or wheezing. This is a chronic problem. The current episode started more than 1 year ago. Pertinent negatives include no dyspnea on exertion, headaches, nasal congestion, sore throat, trouble swallowing or weight loss. His symptoms are aggravated by nothing. His past medical history is significant for asthma.   Coronary Artery Disease:  Patient presents for routine coronary artery disease follow-up. Patient is without symptoms.  Patient denies shortness of breath, diaphoresis, nausea, heartburn and no other symptoms.  Cardiac risk factors include family history of premature cardiovascular disease, male gender, sedentary lifestyle and smoking/ tobacco exposure.   Past Medical History:  Diagnosis Date  . Asthma    Past Surgical History:  Procedure Laterality Date  . CORONARY STENT INTERVENTION N/A 10/18/2018   Procedure: CORONARY STENT INTERVENTION;  Surgeon: Marykay Lex, MD;  Location: [facility];  Service: Cardiovascular;  Laterality: N/A;  . HERNIA REPAIR    . LEFT HEART CATH AND CORONARY  ANGIOGRAPHY N/A 10/16/2018   Procedure: LEFT HEART CATH AND CORONARY ANGIOGRAPHY;  Surgeon: Runell Gess, MD;  Location: [facility];  Service: Cardiovascular;  Laterality: N/A;    Family History  Problem Relation Age of Onset  . Hypertension Mother   . Heart disease Father        Cardiac bypass in his 33s.    Social History   Socioeconomic History  . Marital status: Single    Spouse name: Not on file  . Number of children: Not on file  . Years of education: Not on file  . Highest education level: Not on file  Occupational History  . Not on file  Tobacco Use  . Smoking status: Current Every Day Smoker    Types: Cigarettes  . Smokeless tobacco: Never Used  . Tobacco comment: 2-3 cigarettes/day  Substance and Sexual Activity  . Alcohol use: Yes    Comment: socially   . Drug use: Yes    Types: Marijuana  . Sexual activity: Yes  Other Topics Concern  . Not on file  Social History Narrative  . Not on file   Social Determinants of Health   Financial Resource Strain:   . Difficulty of Paying Living Expenses: Not on file  Food Insecurity:   . Worried About Programme researcher, broadcasting/film/video in the Last Year: Not on file  . Ran Out of Food in the Last Year: Not on file  Transportation Needs:   . Lack of Transportation (Medical): Not on file  . Lack of Transportation (Non-Medical): Not on file  Physical Activity:   . Days of Exercise per Week:  Not on file  . Minutes of Exercise per Session: Not on file  Stress:   . Feeling of Stress : Not on file  Social Connections:   . Frequency of Communication with Friends and Family: Not on file  . Frequency of Social Gatherings with Friends and Family: Not on file  . Attends Religious Services: Not on file  . Active Member of Clubs or Organizations: Not on file  . Attends Banker Meetings: Not on file  . Marital Status: Not on file  Intimate Partner Violence:   . Fear of Current or Ex-Partner: Not on file  .  Emotionally Abused: Not on file  . Physically Abused: Not on file  . Sexually Abused: Not on file    Outpatient Medications Prior to Visit  Medication Sig Dispense Refill  . albuterol (VENTOLIN HFA) 108 (90 Base) MCG/ACT inhaler Inhale 2 puffs into the lungs every 6 (six) hours as needed for wheezing or shortness of breath. 6.7 g 0  . aspirin EC 81 MG tablet Take 1 tablet (81 mg total) by mouth daily. 90 tablet 3  . atorvastatin (LIPITOR) 80 MG tablet TAKE 1 TABLET BY MOUTH DAILY AT 6 PM 90 tablet 2  . clopidogrel (PLAVIX) 75 MG tablet Take 1 tablet (75 mg total) by mouth daily with breakfast. 90 tablet 2  . metoprolol succinate (TOPROL-XL) 25 MG 24 hr tablet Take 0.5 tablets (12.5 mg total) by mouth daily. 45 tablet 1  . nitroGLYCERIN (NITROSTAT) 0.4 MG SL tablet Place 1 tablet (0.4 mg total) under the tongue every 5 (five) minutes as needed. 25 tablet 2   No facility-administered medications prior to visit.    Allergies  Allergen Reactions  . Bc Powder [Aspirin-Salicylamide-Caffeine] Hives  . Tangerine Flavor Rash    ROS Review of Systems  Constitutional: Negative for chills, diaphoresis, fatigue and weight loss.  HENT: Negative.  Negative for sore throat and trouble swallowing.   Eyes: Negative.   Respiratory: Negative.  Negative for cough, sputum production, shortness of breath and wheezing.   Cardiovascular: Negative.  Negative for dyspnea on exertion.  Endocrine: Negative.   Genitourinary: Negative.   Musculoskeletal: Negative.  Negative for arthralgias and back pain.  Skin: Negative.   Neurological: Negative.  Negative for headaches.  Psychiatric/Behavioral: Negative.       Objective:    Physical Exam  Constitutional: He is oriented to person, place, and time. He appears well-developed and well-nourished.  HENT:  Head: Normocephalic and atraumatic.  Cardiovascular: Normal rate and regular rhythm.  Pulmonary/Chest: Effort normal and breath sounds normal.    Abdominal: Soft. Bowel sounds are normal.  Musculoskeletal:        General: Normal range of motion.     Cervical back: Normal range of motion.  Neurological: He is alert and oriented to person, place, and time. He has normal reflexes.  Skin: Skin is warm and dry.    BP 122/63   Pulse 63   Temp 98.6 F (37 C) (Oral)   Ht 5\' 8"  (1.727 m)   Wt 192 lb 6.4 oz (87.3 kg)   SpO2 98%   BMI 29.25 kg/m  Wt Readings from Last 3 Encounters:  07/17/19 192 lb 6.4 oz (87.3 kg)  04/16/19 192 lb 8 oz (87.3 kg)  02/02/19 185 lb (83.9 kg)     There are no preventive care reminders to display for this patient.  There are no preventive care reminders to display for this patient.  No results found  for: TSH Lab Results  Component Value Date   WBC 6.6 10/19/2018   HGB 13.6 10/19/2018   HCT 41.6 10/19/2018   MCV 89.8 10/19/2018   PLT 394 10/19/2018   Lab Results  Component Value Date   NA 137 10/19/2018   K 3.7 10/19/2018   CO2 24 10/19/2018   GLUCOSE 102 (H) 10/19/2018   BUN 9 10/19/2018   CREATININE 1.23 10/19/2018   BILITOT 0.5 12/07/2018   ALKPHOS 85 12/07/2018   AST 32 12/07/2018   ALT 55 (H) 12/07/2018   PROT 6.4 12/07/2018   ALBUMIN 4.5 12/07/2018   CALCIUM 9.2 10/19/2018   ANIONGAP 9 10/19/2018   Lab Results  Component Value Date   CHOL 98 (L) 12/07/2018   Lab Results  Component Value Date   HDL 43 12/07/2018   Lab Results  Component Value Date   LDLCALC 30 12/07/2018   Lab Results  Component Value Date   TRIG 127 12/07/2018   Lab Results  Component Value Date   CHOLHDL 2.3 12/07/2018   Lab Results  Component Value Date   HGBA1C 4.7 07/17/2019      Assessment & Plan:   Problem List Items Addressed This Visit    None    Visit Diagnoses    Health care maintenance    -  Primary   Relevant Orders   POCT glycosylated hemoglobin (Hb A1C) (Completed)   POCT urinalysis dipstick   POCT glucose (manual entry) (Completed)     Mild intermittent asthma,  unspecified whether complicated Recommend smoking cessation. Patient says that he "only smokes marijuana". He has no plans of quitting. He has well controlled asthma. Last exacerbation was several years ago. He was diagnosed as an adult.   Coronary artery disease involving native coronary artery of native heart with unstable angina pectoris [facility]) Patient to continue to follow up with cardiology. Advised to continue medications as prescribed.   Health care maintenance - POCT glycosylated hemoglobin (Hb A1C) - POCT urinalysis dipstick - POCT glucose (manual entry)    Follow-up: Return in about 6 months (around 01/14/2020) for complete physical examination.       Donia Pounds  APRN, MSN, FNP-C Patient [facility] [address] [place], [place] [address] [phone]

## 2019-07-18 DIAGNOSIS — F1290 Cannabis use, unspecified, uncomplicated: Secondary | ICD-10-CM | POA: Insufficient documentation

## 2019-07-18 DIAGNOSIS — J4520 Mild intermittent asthma, uncomplicated: Secondary | ICD-10-CM | POA: Insufficient documentation

## 2019-10-25 ENCOUNTER — Other Ambulatory Visit: Payer: Self-pay | Admitting: Family Medicine

## 2019-10-25 NOTE — Telephone Encounter (Signed)
Refill Plavix. Patient scheduled to see you in August.

## 2019-11-01 ENCOUNTER — Emergency Department (HOSPITAL_COMMUNITY)
Admission: EM | Admit: 2019-11-01 | Discharge: 2019-11-01 | Disposition: A | Discharge status: Home / Self Care | Payer: Self-pay | Attending: Emergency Medicine | Admitting: Emergency Medicine

## 2019-11-01 ENCOUNTER — Encounter (HOSPITAL_COMMUNITY): Payer: Self-pay

## 2019-11-01 ENCOUNTER — Emergency Department (HOSPITAL_COMMUNITY): Payer: Self-pay

## 2019-11-01 ENCOUNTER — Telehealth: Payer: Self-pay | Admitting: Interventional Cardiology

## 2019-11-01 DIAGNOSIS — R42 Dizziness and giddiness: Secondary | ICD-10-CM | POA: Insufficient documentation

## 2019-11-01 DIAGNOSIS — R110 Nausea: Secondary | ICD-10-CM | POA: Insufficient documentation

## 2019-11-01 DIAGNOSIS — I2510 Atherosclerotic heart disease of native coronary artery without angina pectoris: Secondary | ICD-10-CM | POA: Insufficient documentation

## 2019-11-01 DIAGNOSIS — Z955 Presence of coronary angioplasty implant and graft: Secondary | ICD-10-CM | POA: Insufficient documentation

## 2019-11-01 DIAGNOSIS — R002 Palpitations: Secondary | ICD-10-CM | POA: Insufficient documentation

## 2019-11-01 DIAGNOSIS — F17210 Nicotine dependence, cigarettes, uncomplicated: Secondary | ICD-10-CM | POA: Insufficient documentation

## 2019-11-01 DIAGNOSIS — Z79899 Other long term (current) drug therapy: Secondary | ICD-10-CM | POA: Insufficient documentation

## 2019-11-01 DIAGNOSIS — J45909 Unspecified asthma, uncomplicated: Secondary | ICD-10-CM | POA: Insufficient documentation

## 2019-11-01 DIAGNOSIS — R079 Chest pain, unspecified: Secondary | ICD-10-CM

## 2019-11-01 DIAGNOSIS — Z7982 Long term (current) use of aspirin: Secondary | ICD-10-CM | POA: Insufficient documentation

## 2019-11-01 DIAGNOSIS — R0789 Other chest pain: Secondary | ICD-10-CM | POA: Insufficient documentation

## 2019-11-01 LAB — BASIC METABOLIC PANEL
Anion gap: 9 (ref 5–15)
BUN: 17 mg/dL (ref 6–20)
CO2: 23 mmol/L (ref 22–32)
Calcium: 9.2 mg/dL (ref 8.9–10.3)
Chloride: 106 mmol/L (ref 98–111)
Creatinine, Ser: 1.17 mg/dL (ref 0.61–1.24)
GFR calc Af Amer: >60 mL/min (ref >60)
GFR calc non Af Amer: >60 mL/min (ref >60)
Glucose, Bld: 97 mg/dL (ref 70–99)
Potassium: 3.8 mmol/L (ref 3.5–5.1)
Sodium: 138 mmol/L (ref 135–145)

## 2019-11-01 LAB — CBC
HCT: 43.9 % (ref 39.0–52.0)
Hemoglobin: 13.7 g/dL (ref 13.0–17.0)
MCH: 29.1 pg (ref 26.0–34.0)
MCHC: 31.2 g/dL (ref 30.0–36.0)
MCV: 93.2 fL (ref 80.0–100.0)
Platelets: 369 10*3/uL (ref 150–400)
RBC: 4.71 MIL/uL (ref 4.22–5.81)
RDW: 12.3 % (ref 11.5–15.5)
WBC: 5 10*3/uL (ref 4.0–10.5)
nRBC: 0 % (ref 0.0–0.2)

## 2019-11-01 LAB — TROPONIN I (HIGH SENSITIVITY)
Troponin I (High Sensitivity): 3 ng/L (ref <18)
Troponin I (High Sensitivity): <2 ng/L (ref <18)

## 2019-11-01 MED ORDER — SODIUM CHLORIDE 0.9% FLUSH: 3.0000 mL | Freq: Once | INTRAVENOUS | Status: DC

## 2019-11-01 NOTE — Telephone Encounter (Signed)
I spoke to the patient who is at work, but is having "active" CP with SOB, nausea and sweating.  I advised him to go to the ED via EMS for further evaluation.  He verbalized understanding.

## 2019-11-01 NOTE — ED Triage Notes (Signed)
Pt arrives to ED w/ c/o left sided, non-radiating 7/10 chest pain that started this morning. Pt endorses nausea, denies vomiting. Pt states he had a heart attack last year.

## 2019-11-01 NOTE — Telephone Encounter (Signed)
Pt c/o of Chest Pain: STAT if CP now or developed within 24 hours  1. Are you having CP right now? yes  2. Are you experiencing any other symptoms (ex. SOB, nausea, vomiting, sweating)? SOB, nausea, sweating  3. How long have you been experiencing CP? Since this morning  4. Is your CP continuous or coming and going? Comes and goes every 5 or 10 minutes  5. Have you taken Nitroglycerin? No   ?

## 2019-11-01 NOTE — ED Provider Notes (Signed)
[facility] EMERGENCY DEPARTMENT Provider Note   CSN: [id number] Arrival date & time: 11/01/19  1126     History Chief Complaint  Patient presents with  . Chest Pain    Alex Ortiz is a 34 y.o. male.  HPI Patient presents with chest pain.  Sharp left chest.  Began this morning shortly after he got up.  Mild nausea.  States comes and goes.  Last a few minutes earlier now will come and hit and last a few seconds.  Sharp.  No fevers.  No shortness of breath.  Back around a year ago had a non-STEMI.  Thought to be either due to coronary artery disease or spontaneous coronary artery dissection.  Sees Dr. Katrinka Blazing.  Has been doing well since then.  Rarely will have chest pain but not exertional.  Is able to exercise without chest pain. Also states today he will feel his heart fluttering.  States that it will last few seconds.  States the episode of fluttering today he felt lightheaded.  States he had some episodes of both pain and fluttering in the waiting room.    Past Medical History:  Diagnosis Date  . Asthma     Patient Active Problem List   Diagnosis Date Noted  . Marijuana use, continuous 07/18/2019  . Mild intermittent asthma 07/18/2019  . Coronary artery disease involving native coronary artery of native heart with unstable angina pectoris (HCC) 10/18/2018  . Angina at rest [facility]) 10/16/2018  . Chest pain 10/15/2018  . Non-ST elevation (NSTEMI) myocardial infarction (HCC) 10/14/2018  . Asthma 10/14/2018  . Elevated troponin 10/14/2018  . Hyperlipidemia 10/14/2018    Past Surgical History:  Procedure Laterality Date  . CORONARY STENT INTERVENTION N/A 10/18/2018   Procedure: CORONARY STENT INTERVENTION;  Surgeon: Marykay Lex, MD;  Location: [facility] INVASIVE CV LAB;  Service: Cardiovascular;  Laterality: N/A;  . HERNIA REPAIR    . LEFT HEART CATH AND CORONARY ANGIOGRAPHY N/A 10/16/2018   Procedure: LEFT HEART CATH AND CORONARY ANGIOGRAPHY;  Surgeon: Runell Gess, MD;  Location: [facility] INVASIVE CV LAB;  Service: Cardiovascular;  Laterality: N/A;       Family History  Problem Relation Age of Onset  . Hypertension Mother   . Heart disease Father        Cardiac bypass in his 14s.    Social History   Tobacco Use  . Smoking status: Current Every Day Smoker    Types: Cigarettes  . Smokeless tobacco: Never Used  . Tobacco comment: 2-3 cigarettes/day  Substance Use Topics  . Alcohol use: Yes    Comment: socially   . Drug use: Yes    Types: Marijuana    Home Medications Prior to Admission medications   Medication Sig Start Date End Date Taking? Authorizing Provider  albuterol (VENTOLIN HFA) 108 (90 Base) MCG/ACT inhaler Inhale 2 puffs into the lungs every 6 (six) hours as needed for wheezing or shortness of breath. 04/16/19  Yes Lyn Records, MD  aspirin EC 81 MG tablet Take 1 tablet (81 mg total) by mouth daily. 04/16/19  Yes Lyn Records, MD  atorvastatin (LIPITOR) 80 MG tablet TAKE 1 TABLET BY MOUTH DAILY AT 6 PM Patient taking differently: Take 80 mg by mouth daily.  07/04/19  Yes [name] B, NP  clopidogrel (PLAVIX) 75 MG tablet TAKE 1 TABLET(75 MG) BY MOUTH DAILY WITH BREAKFAST Patient taking differently: Take 75 mg by mouth daily.  10/26/19  Yes Barbette Merino,  NP  metoprolol succinate (TOPROL-XL) 25 MG 24 hr tablet Take 0.5 tablets (12.5 mg total) by mouth daily. 12/08/18  Yes Belva Crome, MD  nitroGLYCERIN (NITROSTAT) 0.4 MG SL tablet Place 1 tablet (0.4 mg total) under the tongue every 5 (five) minutes as needed. 10/19/18  Yes Cheryln Manly, NP    Allergies    Bc powder [aspirin-salicylamide-caffeine] and Tangerine flavor  Review of Systems   Review of Systems  Constitutional: Negative for appetite change.  HENT: Negative for congestion.   Respiratory: Positive for chest tightness.   Cardiovascular: Positive for chest pain and palpitations.  Gastrointestinal: Positive for nausea. Negative for abdominal  pain.  Genitourinary: Negative for flank pain.  Musculoskeletal: Negative for back pain.  Skin: Negative for rash.  Neurological: Negative for weakness.  Psychiatric/Behavioral: Negative for confusion.    Physical Exam Updated Vital Signs BP 117/80   Pulse (!) 56   Temp 97.9 F (36.6 C) (Oral)   Resp 15   SpO2 97%   Physical Exam Vitals and nursing note reviewed.  HENT:     Head: Normocephalic.  Cardiovascular:     Rate and Rhythm: Regular rhythm.  Pulmonary:     Breath sounds: No decreased breath sounds, wheezing or rhonchi.  Chest:     Chest wall: No tenderness.  Abdominal:     Tenderness: There is no abdominal tenderness.  Musculoskeletal:     Cervical back: Neck supple.     Right lower leg: No edema.     Left lower leg: No edema.  Skin:    General: Skin is warm.     Capillary Refill: Capillary refill takes less than 2 seconds.  Neurological:     Mental Status: He is alert and oriented to person, place, and time.     ED Results / Procedures / Treatments   Labs (all labs ordered are listed, but only abnormal results are displayed) Labs Reviewed  BASIC METABOLIC PANEL  CBC  TROPONIN I (HIGH SENSITIVITY)  TROPONIN I (HIGH SENSITIVITY)    EKG EKG Interpretation  Date/Time:  Thursday November 01 2019 11:32:50 EDT Ventricular Rate:  58 PR Interval:  160 QRS Duration: 90 QT Interval:  378 QTC Calculation: 371 R Axis:   25 Text Interpretation: Sinus bradycardia Septal infarct , age undetermined Abnormal ECG Confirmed by Davonna Belling [phone]) on 11/01/2019 12:31:20 PM   Radiology DG Chest 2 View  Result Date: 11/01/2019 CLINICAL DATA:  Chest pain EXAM: CHEST - 2 VIEW COMPARISON:  10/13/2018 FINDINGS: The heart size and mediastinal contours are within normal limits. Both lungs are clear. The visualized skeletal structures are unremarkable. IMPRESSION: No active cardiopulmonary disease. Electronically Signed   By: Davina Poke D.O.   On: 11/01/2019 12:20     Procedures Procedures (including critical care time)  Medications Ordered in ED Medications  sodium chloride flush (NS) 0.9 % injection 3 mL (has no administration in time range)    ED Course  I have reviewed the triage vital signs and the nursing notes.  Pertinent labs & imaging results that were available during my care of the patient were reviewed by me and considered in my medical decision making (see chart for details).    MDM Rules/Calculators/A&P                      Patient with chest pain.  Left upper chest.  Has been going constant this morning.  Does have a sharp component that comes and goes.  Previous coronary artery disease.  However not exertional.  EKG reassuring.  Troponin negative x2.  Doubt this is a cardiac cause.  May be musculoskeletal.  Follow-up with PCP and cardiology as an outpatient Final Clinical Impression(s) / ED Diagnoses Final diagnoses:  Nonspecific chest pain    Rx / DC Orders ED Discharge Orders    None       Benjiman Core, MD 11/01/19 1619

## 2019-11-13 ENCOUNTER — Telehealth: Payer: Self-pay | Admitting: Interventional Cardiology

## 2019-11-13 NOTE — Telephone Encounter (Signed)
Alex Ortiz is calling wanting to know if Dr. Katrinka Blazing can write him a letter for him to get an emotional support dog. Please advise.

## 2019-11-13 NOTE — Telephone Encounter (Signed)
Called patient to let him know Dr. Katrinka Blazing and his nurse will call him about his letter next week. Patient verbalized understanding.

## 2019-11-19 NOTE — Telephone Encounter (Signed)
Spoke with pt and made him aware that he would need to reach out to PCP for this letter.  Pt appreciative for call.

## 2019-11-21 ENCOUNTER — Other Ambulatory Visit: Payer: Self-pay

## 2019-11-21 ENCOUNTER — Ambulatory Visit (INDEPENDENT_AMBULATORY_CARE_PROVIDER_SITE_OTHER): Payer: Self-pay | Admitting: Nurse Practitioner

## 2019-11-21 ENCOUNTER — Encounter: Payer: Self-pay | Admitting: Nurse Practitioner

## 2019-11-21 VITALS — BP 126/84 | HR 98 | Temp 97.5°F | Ht 68.0 in | Wt 199.6 lb

## 2019-11-21 DIAGNOSIS — F321 Major depressive disorder, single episode, moderate: Secondary | ICD-10-CM

## 2019-11-21 DIAGNOSIS — I25110 Atherosclerotic heart disease of native coronary artery with unstable angina pectoris: Secondary | ICD-10-CM

## 2019-11-21 LAB — POCT URINALYSIS DIPSTICK
Bilirubin, UA: NEGATIVE
Blood, UA: NEGATIVE
Glucose, UA: NEGATIVE
Ketones, UA: NEGATIVE
Leukocytes, UA: NEGATIVE
Nitrite, UA: NEGATIVE
Protein, UA: NEGATIVE
Spec Grav, UA: 1.025 (ref 1.010–1.025)
Urobilinogen, UA: 0.2 E.U./dL
pH, UA: 5 (ref 5.0–8.0)

## 2019-11-21 NOTE — Progress Notes (Signed)
[facility] Patient [facility] [address] [place], [place]  [address] Phone:  [phone]   Fax:  [phone]   Established Patient Office Visit  Subjective:  Patient ID: Alex Ortiz, male    DOB: 1985/08/19  Age: 34 y.o. MRN: [id number]  CC:  Chief Complaint  Patient presents with  . Depression    wants a letter for emotional animal     HPI Alex Ortiz presents for depression. He has been lost to follow up.  He was last seen by Mike Gip 11/02/2018. She  has a past medical history of Asthma.  Non-ST elevation myocardial infarction 09/2018.  There is a family history of heart disease in his father.  He is in today because he is wanting a emotional support animal.  He admitted that his current townhome policy is that she cannot have large breed dogs.  Smaller dogs are allowed.  He will not indicating what type of dog and he is interested in.  He is want a letter for his depression. 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  He admits that because of his heart condition and criminal record he is not able to find employment; despite his degree in criminal justice.  He admits that he have a long history of depression however not treated..   Depression Patient complains of depression. He complains of depressed mood. Onset was approximately several Years ago. Symptoms have been unchanged since that time. Current symptoms include: depressed mood. Patient denies difficulty concentrating, feelings of worthlessness/guilt, hopelessness, hypersomnia, impaired memory, insomnia, psychomotor agitation, psychomotor retardation, recurrent thoughts of death, suicidal attempt and weight loss. Family history significant for heart disease. Possible organic causes contributing are: He continues to smoke marijuana and states that he will not be stopping..    Risk factors: negative life event Heart attack and previous episode of depression. Previous treatment includes none. He is not interested in any  treatment.  He admits that he is currently living alone which he thank also affects his depression. Denies headache, dizziness, visual changes, shortness of breath, chest pain, nausea, vomiting or any edema.    Past Medical History:  Diagnosis Date  . Asthma     Past Surgical History:  Procedure Laterality Date  . CORONARY STENT INTERVENTION N/A 10/18/2018   Procedure: CORONARY STENT INTERVENTION;  Surgeon: Marykay Lex, MD;  Location: [facility] INVASIVE CV LAB;  Service: Cardiovascular;  Laterality: N/A;  . HERNIA REPAIR    . LEFT HEART CATH AND CORONARY ANGIOGRAPHY N/A 10/16/2018   Procedure: LEFT HEART CATH AND CORONARY ANGIOGRAPHY;  Surgeon: Runell Gess, MD;  Location: [facility] INVASIVE CV LAB;  Service: Cardiovascular;  Laterality: N/A;    Family History  Problem Relation Age of Onset  . Hypertension Mother   . Heart disease Father        Cardiac bypass in his 72s.    Social History   Socioeconomic History  . Marital status: Single    Spouse name: Not on file  . Number of children: Not on file  . Years of education: Not on file  . Highest education level: Not on file  Occupational History  . Not on file  Tobacco Use  . Smoking status: Current Every Day Smoker    Types: Cigarettes  . Smokeless tobacco: Never Used  . Tobacco comment: 2-3 cigarettes/day  Vaping Use  . Vaping Use: Never used  Substance and Sexual Activity  . Alcohol use: Yes    Comment: socially   .  Drug use: Yes    Types: Marijuana  . Sexual activity: Yes  Other Topics Concern  . Not on file  Social History Narrative  . Not on file   Social Determinants of Health   Financial Resource Strain:   . Difficulty of Paying Living Expenses:   Food Insecurity:   . Worried About Charity fundraiser in the Last Year:   . Arboriculturist in the Last Year:   Transportation Needs:   . Film/video editor (Medical):   Marland Kitchen Lack of Transportation (Non-Medical):   Physical Activity:   . Days of Exercise  per Week:   . Minutes of Exercise per Session:   Stress:   . Feeling of Stress :   Social Connections:   . Frequency of Communication with Friends and Family:   . Frequency of Social Gatherings with Friends and Family:   . Attends Religious Services:   . Active Member of Clubs or Organizations:   . Attends Archivist Meetings:   Marland Kitchen Marital Status:   Intimate Partner Violence:   . Fear of Current or Ex-Partner:   . Emotionally Abused:   Marland Kitchen Physically Abused:   . Sexually Abused:     Outpatient Medications Prior to Visit  Medication Sig Dispense Refill  . albuterol (VENTOLIN HFA) 108 (90 Base) MCG/ACT inhaler Inhale 2 puffs into the lungs every 6 (six) hours as needed for wheezing or shortness of breath. 6.7 g 0  . aspirin EC 81 MG tablet Take 1 tablet (81 mg total) by mouth daily. 90 tablet 3  . atorvastatin (LIPITOR) 80 MG tablet TAKE 1 TABLET BY MOUTH DAILY AT 6 PM (Patient taking differently: Take 80 mg by mouth daily. ) 90 tablet 2  . clopidogrel (PLAVIX) 75 MG tablet TAKE 1 TABLET(75 MG) BY MOUTH DAILY WITH BREAKFAST (Patient taking differently: Take 75 mg by mouth daily. ) 90 tablet 2  . metoprolol succinate (TOPROL-XL) 25 MG 24 hr tablet Take 0.5 tablets (12.5 mg total) by mouth daily. 45 tablet 1  . nitroGLYCERIN (NITROSTAT) 0.4 MG SL tablet Place 1 tablet (0.4 mg total) under the tongue every 5 (five) minutes as needed. 25 tablet 2   No facility-administered medications prior to visit.    Allergies  Allergen Reactions  . Bc Powder [Aspirin-Salicylamide-Caffeine] Hives  . Tangerine Flavor Rash    ROS Review of Systems  Respiratory: Positive for shortness of breath.        On exertion      Objective:    Physical Exam Constitutional:      General: He is not in acute distress.    Appearance: He is obese. He is not ill-appearing or toxic-appearing.  HENT:     Head: Normocephalic and atraumatic.     Mouth/Throat:     Mouth: Mucous membranes are moist.    Cardiovascular:     Rate and Rhythm: Normal rate and regular rhythm.     Pulses: Normal pulses.  Pulmonary:     Effort: Pulmonary effort is normal.     Breath sounds: Normal breath sounds.  Abdominal:     General: Abdomen is flat. Bowel sounds are normal.     Palpations: Abdomen is soft.  Musculoskeletal:        General: Normal range of motion.     Cervical back: Normal range of motion.  Skin:    General: Skin is warm and dry.     Capillary Refill: Capillary refill takes less than 2  seconds.  Neurological:     General: No focal deficit present.     Mental Status: He is alert and oriented to person, place, and time.  Psychiatric:        Thought Content: Thought content normal.     BP 126/84 (BP Location: Left Arm, Patient Position: Sitting, Cuff Size: Normal)   Pulse 98   Temp (!) 97.5 F (36.4 C) (Temporal)   Ht 5\' 8"  (1.727 m)   Wt 199 lb 9.6 oz (90.5 kg)   SpO2 (!) 72%   BMI 30.35 kg/m  Wt Readings from Last 3 Encounters:  11/21/19 199 lb 9.6 oz (90.5 kg)  07/17/19 192 lb 6.4 oz (87.3 kg)  04/16/19 192 lb 8 oz (87.3 kg)     Health Maintenance Due  Topic Date Due  . Hepatitis C Screening  Never done  . COVID-19 Vaccine (1) Never done  . TETANUS/TDAP  Never done    There are no preventive care reminders to display for this patient.  No results found for: TSH Lab Results  Component Value Date   WBC 5.0 11/01/2019   HGB 13.7 11/01/2019   HCT 43.9 11/01/2019   MCV 93.2 11/01/2019   PLT 369 11/01/2019   Lab Results  Component Value Date   NA 138 11/21/2019   K 4.5 11/21/2019   CO2 23 11/01/2019   GLUCOSE 93 11/21/2019   BUN 20 11/21/2019   CREATININE 1.16 11/21/2019   BILITOT 0.5 11/21/2019   ALKPHOS 74 11/21/2019   AST 28 11/21/2019   ALT 55 (H) 12/07/2018   PROT 7.1 11/21/2019   ALBUMIN 4.6 11/21/2019   CALCIUM 9.5 11/21/2019   ANIONGAP 9 11/01/2019   Lab Results  Component Value Date   CHOL 156 11/21/2019   Lab Results  Component Value  Date   HDL 50 11/21/2019   Lab Results  Component Value Date   LDLCALC 91 11/21/2019   Lab Results  Component Value Date   TRIG 81 11/21/2019   Lab Results  Component Value Date   CHOLHDL 3.1 11/21/2019   Lab Results  Component Value Date   HGBA1C 4.7 07/17/2019      Assessment & Plan:   Problem List Items Addressed This Visit      Cardiovascular and Mediastinum   Coronary artery disease involving native coronary artery of native heart with unstable angina pectoris (HCC) - Primary   Relevant Orders   Comp. Metabolic Panel (12) (Completed)   Lipid panel (Completed)   POCT Urinalysis Dipstick (Completed) Continue to follow with cardiology    Other Visit Diagnoses    Current moderate episode of major depressive disorder, unspecified whether recurrent (HCC)     Declined any treatment of counseling or medication      No orders of the defined types were placed in this encounter.   Follow-up: Return in about 3 months (around 02/21/2020).    02/23/2020, NP

## 2019-11-21 NOTE — Patient Instructions (Signed)
Living With Depression Everyone experiences occasional disappointment, sadness, and loss in their lives. When you are feeling down, blue, or sad for at least 2 weeks in a row, it may mean that you have depression. Depression can affect your thoughts and feelings, relationships, daily activities, and physical health. It is caused by changes in the way your brain functions. If you receive a diagnosis of depression, your health care provider will tell you which type of depression you have and what treatment options are available to you. If you are living with depression, there are ways to help you recover from it and also ways to prevent it from coming back. How to cope with lifestyle changes Coping with stress     Stress is your body's reaction to life changes and events, both good and bad. Stressful situations may include:  Getting married.  The death of a spouse.  Losing a job.  Retiring.  Having a baby. Stress can last just a few hours or it can be ongoing. Stress can play a major role in depression, so it is important to learn both how to cope with stress and how to think about it differently. Talk with your health care provider or a counselor if you would like to learn more about stress reduction. He or she may suggest some stress reduction techniques, such as:  Music therapy. This can include creating music or listening to music. Choose music that you enjoy and that inspires you.  Mindfulness-based meditation. This kind of meditation can be done while sitting or walking. It involves being aware of your normal breaths, rather than trying to control your breathing.  Centering prayer. This is a kind of meditation that involves focusing on a spiritual word or phrase. Choose a word, phrase, or sacred image that is meaningful to you and that brings you peace.  Deep breathing. To do this, expand your stomach and inhale slowly through your nose. Hold your breath for 3-5 seconds, then exhale  slowly, allowing your stomach muscles to relax.  Muscle relaxation. This involves intentionally tensing muscles then relaxing them. Choose a stress reduction technique that fits your lifestyle and personality. Stress reduction techniques take time and practice to develop. Set aside 5-15 minutes a day to do them. Therapists can offer training in these techniques. The training may be covered by some insurance plans. Other things you can do to manage stress include:  Keeping a stress diary. This can help you learn what triggers your stress and ways to control your response.  Understanding what your limits are and saying no to requests or events that lead to a schedule that is too full.  Thinking about how you respond to certain situations. You may not be able to control everything, but you can control how you react.  Adding humor to your life by watching funny films or TV shows.  Making time for activities that help you relax and not feeling guilty about spending your time this way.  Medicines Your health care provider may suggest certain medicines if he or she feels that they will help improve your condition. Avoid using alcohol and other substances that may prevent your medicines from working properly (may interact). It is also important to:  Talk with your pharmacist or health care provider about all the medicines that you take, their possible side effects, and what medicines are safe to take together.  Make it your goal to take part in all treatment decisions (shared decision-making). This includes giving input on   the side effects of medicines. It is best if shared decision-making with your health care provider is part of your total treatment plan. If your health care provider prescribes a medicine, you may not notice the full benefits of it for 4-8 weeks. Most people who are treated for depression need to be on medicine for at least 6-12 months after they feel better. If you are taking  medicines as part of your treatment, do not stop taking medicines without first talking to your health care provider. You may need to have the medicine slowly decreased (tapered) over time to decrease the risk of harmful side effects. Relationships Your health care provider may suggest family therapy along with individual therapy and drug therapy. While there may not be family problems that are causing you to feel depressed, it is still important to make sure your family learns as much as they can about your mental health. Having your family's support can help make your treatment successful. How to recognize changes in your condition Everyone has a different response to treatment for depression. Recovery from major depression happens when you have not had signs of major depression for two months. This may mean that you will start to:  Have more interest in doing activities.  Feel less hopeless than you did 2 months ago.  Have more energy.  Overeat less often, or have better or improving appetite.  Have better concentration. Your health care provider will work with you to decide the next steps in your recovery. It is also important to recognize when your condition is getting worse. Watch for these signs:  Having fatigue or low energy.  Eating too much or too little.  Sleeping too much or too little.  Feeling restless, agitated, or hopeless.  Having trouble concentrating or making decisions.  Having unexplained physical complaints.  Feeling irritable, angry, or aggressive. Get help as soon as you or your family members notice these symptoms coming back. How to get support and help from others How to talk with friends and family members about your condition  Talking to friends and family members about your condition can provide you with one way to get support and guidance. Reach out to trusted friends or family members, explain your symptoms to them, and let them know that you are  working with a health care provider to treat your depression. Financial resources Not all insurance plans cover mental health care, so it is important to check with your insurance carrier. If paying for co-pays or counseling services is a problem, search for a local or county mental health care center. They may be able to offer public mental health care services at low or no cost when you are not able to see a private health care provider. If you are taking medicine for depression, you may be able to get the generic form, which may be less expensive. Some makers of prescription medicines also offer help to patients who cannot afford the medicines they need. Follow these instructions at home:   Get the right amount and quality of sleep.  Cut down on using caffeine, tobacco, alcohol, and other potentially harmful substances.  Try to exercise, such as walking or lifting small weights.  Take over-the-counter and prescription medicines only as told by your health care provider.  Eat a healthy diet that includes plenty of vegetables, fruits, whole grains, low-fat dairy products, and lean protein. Do not eat a lot of foods that are high in solid fats, added sugars, or salt.    Keep all follow-up visits as told by your health care provider. This is important. Contact a health care provider if:  You stop taking your antidepressant medicines, and you have any of these symptoms: ? Nausea. ? Headache. ? Feeling lightheaded. ? Chills and body aches. ? Not being able to sleep (insomnia).  You or your friends and family think your depression is getting worse. Get help right away if:  You have thoughts of hurting yourself or others. If you ever feel like you may hurt yourself or others, or have thoughts about taking your own life, get help right away. You can go to your nearest emergency department or call:  Your local emergency services (911 in the [place]  A suicide crisis helpline, such as the  National Suicide Prevention Lifeline at 1-800-273-8255. This is open 24-hours a day. Summary  If you are living with depression, there are ways to help you recover from it and also ways to prevent it from coming back.  Work with your health care team to create a management plan that includes counseling, stress management techniques, and healthy lifestyle habits. This information is not intended to replace advice given to you by your health care provider. Make sure you discuss any questions you have with your health care provider. Document Revised: 09/08/2018 Document Reviewed: 04/19/2016 Elsevier Patient Education  2020 Elsevier Inc.  

## 2019-11-22 LAB — LIPID PANEL
Chol/HDL Ratio: 3.1 ratio (ref 0.0–5.0)
Cholesterol, Total: 156 mg/dL (ref 100–199)
HDL: 50 mg/dL (ref >39)
LDL Chol Calc (NIH): 91 mg/dL (ref 0–99)
Triglycerides: 81 mg/dL (ref 0–149)
VLDL Cholesterol Cal: 15 mg/dL (ref 5–40)

## 2019-11-22 LAB — COMP. METABOLIC PANEL (12)
AST: 28 IU/L (ref 0–40)
Albumin/Globulin Ratio: 1.8 (ref 1.2–2.2)
Albumin: 4.6 g/dL (ref 4.0–5.0)
Alkaline Phosphatase: 74 IU/L (ref 48–121)
BUN/Creatinine Ratio: 17 (ref 9–20)
BUN: 20 mg/dL (ref 6–20)
Bilirubin Total: 0.5 mg/dL (ref 0.0–1.2)
Calcium: 9.5 mg/dL (ref 8.7–10.2)
Chloride: 101 mmol/L (ref 96–106)
Creatinine, Ser: 1.16 mg/dL (ref 0.76–1.27)
GFR calc Af Amer: 96 mL/min/{1.73_m2} (ref >59)
GFR calc non Af Amer: 83 mL/min/{1.73_m2} (ref >59)
Globulin, Total: 2.5 g/dL (ref 1.5–4.5)
Glucose: 93 mg/dL (ref 65–99)
Potassium: 4.5 mmol/L (ref 3.5–5.2)
Sodium: 138 mmol/L (ref 134–144)
Total Protein: 7.1 g/dL (ref 6.0–8.5)

## 2019-11-25 NOTE — Progress Notes (Signed)
Cardiology Office Note:    Date:  11/26/2019   ID:  Alex Ortiz, DOB [birth year], MRN [id number]  PCP:  Mike Gip, FNP  Cardiologist:  Lesleigh Noe, MD   Referring MD: Mike Gip, FNP   Chief Complaint  Patient presents with  . Coronary Artery Disease    History of Present Illness:    Alex Ortiz is a 34 y.o. male with a hx of anterior NSTEMI treated with DES. 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  Narrowing extended back to the left main.  He has been asymptomatic over the past 12 months.  He is very depressed because at 34 years of age this is changed his quality of life.  He is unable to find work.  He is frightened that it will happen again.  He is tearful.  He wants a support dog.  He feels depressed.  He is angry.  He is not suicidal.  Past Medical History:  Diagnosis Date  . Asthma     Past Surgical History:  Procedure Laterality Date  . CORONARY STENT INTERVENTION N/A 10/18/2018   Procedure: CORONARY STENT INTERVENTION;  Surgeon: Marykay Lex, MD;  Location: [facility]VASIVE CV LAB;  Service: Cardiovascular;  Laterality: N/A;  . HERNIA REPAIR    . LEFT HEART CATH AND CORONARY ANGIOGRAPHY N/A 10/16/2018   Procedure: LEFT HEART CATH AND CORONARY ANGIOGRAPHY;  Surgeon: Runell Gess, MD;  Location: [facility] INVASIVE CV LAB;  Service: Cardiovascular;  Laterality: N/A;    Current Medications: Current Meds  Medication Sig  . albuterol (VENTOLIN HFA) 108 (90 Base) MCG/ACT inhaler Inhale 2 puffs into the lungs every 6 (six) hours as needed for wheezing or shortness of breath.  Marland Kitchen atorvastatin (LIPITOR) 80 MG tablet TAKE 1 TABLET BY MOUTH DAILY AT 6 PM  . clopidogrel (PLAVIX) 75 MG tablet TAKE 1 TABLET(75 MG) BY MOUTH DAILY WITH BREAKFAST  . nitroGLYCERIN (NITROSTAT) 0.4 MG SL tablet Place 1 tablet (0.4 mg  total) under the tongue every 5 (five) minutes as needed.  . [DISCONTINUED] aspirin EC 81 MG tablet Take 1 tablet (81 mg total) by mouth daily.  . [DISCONTINUED] metoprolol succinate (TOPROL-XL) 25 MG 24 hr tablet Take 0.5 tablets (12.5 mg total) by mouth daily.     Allergies:   Bc powder [aspirin-salicylamide-caffeine] and Tangerine flavor   Social History   Socioeconomic History  . Marital status: Single    Spouse name: Not on file  . Number of children: Not on file  . Years of education: Not on file  . Highest education level: Not on file  Occupational History  . Not on file  Tobacco Use  . Smoking status: Current Every Day Smoker    Types: Cigarettes  . Smokeless tobacco: Never Used  . Tobacco comment: 2-3 cigarettes/day  Vaping Use  . Vaping Use: Never used  Substance and Sexual Activity  . Alcohol use: Yes    Comment: socially   . Drug use: Yes    Types: Marijuana  . Sexual activity: Yes  Other Topics Concern  . Not on file  Social History Narrative  . Not on file   Social Determinants of Health   Financial Resource Strain:   . Difficulty of Paying Living Expenses:   Food Insecurity:   . Worried About Programme researcher, broadcasting/film/video in the Last Year:   .  Ran Out of Food in the Last Year:   Transportation Needs:   . Freight forwarder (Medical):   Marland Kitchen Lack of Transportation (Non-Medical):   Physical Activity:   . Days of Exercise per Week:   . Minutes of Exercise per Session:   Stress:   . Feeling of Stress :   Social Connections:   . Frequency of Communication with Friends and Family:   . Frequency of Social Gatherings with Friends and Family:   . Attends Religious Services:   . Active Member of Clubs or Organizations:   . Attends Banker Meetings:   Marland Kitchen Marital Status:      Family History: The patient's family history includes Heart disease in his father; Hypertension in his mother.  ROS:   Please see the history of present illness.    No new  data.  No chest pain.  No angina.  All other systems reviewed and are negative.  EKGs/Labs/Other Studies Reviewed:    The following studies were reviewed today:  CARDIAC CATH 09/2018: Diagnostic Dominance: Right  Intervention      EKG:  EKG not repeated  Recent Labs: 12/07/2018: ALT 55 11/01/2019: Hemoglobin 13.7; Platelets 369 11/21/2019: BUN 20; Creatinine, Ser 1.16; Potassium 4.5; Sodium 138  Recent Lipid Panel    Component Value Date/Time   CHOL 156 11/21/2019 1414   TRIG 81 11/21/2019 1414   HDL 50 11/21/2019 1414   CHOLHDL 3.1 11/21/2019 1414   CHOLHDL 4.1 10/14/2018 0205   VLDL 14 10/14/2018 0205   LDLCALC 91 11/21/2019 1414    Physical Exam:    VS:  BP 132/82   Pulse 64   Ht 5\' 8"  (1.727 m)   Wt 198 lb 9.6 oz (90.1 kg)   SpO2 98%   BMI 30.20 kg/m     Wt Readings from Last 3 Encounters:  11/26/19 198 lb 9.6 oz (90.1 kg)  11/21/19 199 lb 9.6 oz (90.5 kg)  07/17/19 192 lb 6.4 oz (87.3 kg)     GEN: Young, slightly overweight.. No acute distress HEENT: Normal NECK: No JVD. LYMPHATICS: No lymphadenopathy CARDIAC:  RRR without murmur, gallop, or edema. VASCULAR:  Normal Pulses. No bruits. RESPIRATORY:  Clear to auscultation without rales, wheezing or rhonchi  ABDOMEN: Soft, non-tender, non-distended, No pulsatile mass, MUSCULOSKELETAL: No deformity  SKIN: Warm and dry NEUROLOGIC:  Alert and oriented x 3 PSYCHIATRIC:  Normal affect   ASSESSMENT:    1. Atherosclerosis of native coronary artery of native heart without angina pectoris   2. Hyperlipidemia LDL goal <70   3. Other male erectile dysfunction   4. Elevated BP without diagnosis of hypertension   5. Educated about COVID-19 virus infection   6. Emotional depression    PLAN:    In order of problems listed above:  1. Anterior myocardial infarction related to probable spontaneous coronary artery dissection that required stent implantation.  No recurrent angina now greater than 1 year out.   Discontinue aspirin.  Continue management of lipids to LDL less than 70.  Increase physical activity.  Symptom limited exercise treadmill test to assess exertional tolerance. 2. Continue high intensity statin therapy 3. Likely related to vascular disease.  Discontinue Toprol-XL. 4. Monitor blood pressure for evidence of hypertension off Toprol. 5. Covid vaccine has not been received.  Social distancing is being practiced. 6. We need him to see a mental health professional related to reactive depression.  Plan 96-month follow-up.   Medication Adjustments/Labs and Tests Ordered: Current medicines are  reviewed at length with the patient today.  Concerns regarding medicines are outlined above.  Orders Placed This Encounter  Procedures  . Ambulatory referral to Psychology  . Exercise Tolerance Test   No orders of the defined types were placed in this encounter.   Patient Instructions  Medication Instructions:  1) DISCONTINUE Aspirin 2) DISCONTINUE Metoprolol  *If you need a refill on your cardiac medications before your next appointment, please call your pharmacy*   Lab Work: None If you have labs (blood work) drawn today and your tests are completely normal, you will receive your results only by: Marland Kitchen MyChart Message (if you have MyChart) OR . A paper copy in the mail If you have any lab test that is abnormal or we need to change your treatment, we will call you to review the results.   Testing/Procedures: Your physician has requested that you have an exercise tolerance test. For further information please visit [email or web address]. Please also follow instruction sheet, as given.    Follow-Up: At [facility], you and your health needs are our priority.  As part of our continuing mission to provide you with exceptional heart care, we have created designated Provider Care Teams.  These Care Teams include your primary Cardiologist (physician) and Advanced Practice Providers (APPs -   Physician Assistants and Nurse Practitioners) who all work together to provide you with the care you need, when you need it.  We recommend signing up for the patient portal called "MyChart".  Sign up information is provided on this After Visit Summary.  MyChart is used to connect with patients for Virtual Visits (Telemedicine).  Patients are able to view lab/test results, encounter notes, upcoming appointments, etc.  Non-urgent messages can be sent to your provider as well.   To learn more about what you can do with MyChart, go to [email or web address].    Your next appointment:   6 month(s)  The format for your next appointment:   In Person  Provider:   You may see Sinclair Grooms, MD or one of the following Advanced Practice Providers on your designated Care Team:    Truitt Merle, NP  Cecilie Kicks, NP  Kathyrn Drown, NP    Other Instructions      Signed, Sinclair Grooms, MD  11/26/2019 10:30 AM    [place]

## 2019-11-26 ENCOUNTER — Ambulatory Visit (INDEPENDENT_AMBULATORY_CARE_PROVIDER_SITE_OTHER): Payer: Self-pay | Admitting: Interventional Cardiology

## 2019-11-26 ENCOUNTER — Other Ambulatory Visit: Payer: Self-pay

## 2019-11-26 ENCOUNTER — Encounter: Payer: Self-pay | Admitting: Interventional Cardiology

## 2019-11-26 VITALS — BP 132/82 | HR 64 | Ht 68.0 in | Wt 198.6 lb

## 2019-11-26 DIAGNOSIS — R4589 Other symptoms and signs involving emotional state: Secondary | ICD-10-CM

## 2019-11-26 DIAGNOSIS — F329 Major depressive disorder, single episode, unspecified: Secondary | ICD-10-CM

## 2019-11-26 DIAGNOSIS — I2510 Atherosclerotic heart disease of native coronary artery without angina pectoris: Secondary | ICD-10-CM

## 2019-11-26 DIAGNOSIS — Z7189 Other specified counseling: Secondary | ICD-10-CM

## 2019-11-26 DIAGNOSIS — N528 Other male erectile dysfunction: Secondary | ICD-10-CM

## 2019-11-26 DIAGNOSIS — R030 Elevated blood-pressure reading, without diagnosis of hypertension: Secondary | ICD-10-CM

## 2019-11-26 DIAGNOSIS — E785 Hyperlipidemia, unspecified: Secondary | ICD-10-CM

## 2019-11-26 NOTE — Patient Instructions (Addendum)
Medication Instructions:  1) DISCONTINUE Aspirin 2) DISCONTINUE Metoprolol  *If you need a refill on your cardiac medications before your next appointment, please call your pharmacy*   Lab Work: None If you have labs (blood work) drawn today and your tests are completely normal, you will receive your results only by: Marland Kitchen MyChart Message (if you have MyChart) OR . A paper copy in the mail If you have any lab test that is abnormal or we need to change your treatment, we will call you to review the results.   Testing/Procedures: Your physician has requested that you have an exercise tolerance test. For further information please visit [email or web address] Please also follow instruction sheet, as given.    Follow-Up: At [facility], you and your health needs are our priority.  As part of our continuing mission to provide you with exceptional heart care, we have created designated Provider Care Teams.  These Care Teams include your primary Cardiologist (physician) and Advanced Practice Providers (APPs -  Physician Assistants and Nurse Practitioners) who all work together to provide you with the care you need, when you need it.  We recommend signing up for the patient portal called "MyChart".  Sign up information is provided on this After Visit Summary.  MyChart is used to connect with patients for Virtual Visits (Telemedicine).  Patients are able to view lab/test results, encounter notes, upcoming appointments, etc.  Non-urgent messages can be sent to your provider as well.   To learn more about what you can do with MyChart, go to [email or web address].    Your next appointment:   6 month(s)  The format for your next appointment:   In Person  Provider:   You may see Lesleigh Noe, MD or one of the following Advanced Practice Providers on your designated Care Team:    Norma Fredrickson, NP  Nada Boozer, NP  Georgie Chard, NP    Other Instructions

## 2019-11-29 DEATH — deceased

## 2020-01-14 ENCOUNTER — Encounter: Payer: Self-pay | Admitting: Nurse Practitioner

## 2020-02-03 ENCOUNTER — Other Ambulatory Visit: Payer: Self-pay | Admitting: Family Medicine

## 2020-02-21 ENCOUNTER — Ambulatory Visit: Payer: Self-pay | Admitting: Nurse Practitioner

## 2020-05-01 ENCOUNTER — Ambulatory Visit: Payer: Self-pay | Admitting: Interventional Cardiology

## 2020-08-01 IMAGING — CT CT ANGIO CHEST-ABD-PELV FOR DISSECTION W/ AND WO/W CM
2 of 7 series · 12 of 46 positions shown, 14 images · IV contrast (APPLIED)
Comparison: Chest CT September 23, 2016; chest radiograph October 13, 2018

CLINICAL DATA: Chest and abdomen pain

EXAM:
CT ANGIOGRAPHY CHEST, ABDOMEN AND PELVIS
TECHNIQUE: Initially, axial CT images were obtained through the chest without
intravenous contrast material administration. Multidetector CT
imaging through the chest, abdomen and pelvis was performed using
the standard protocol during bolus administration of intravenous
contrast. Multiplanar reconstructed images and MIPs were obtained
and reviewed to evaluate the vascular anatomy.
CONTRAST:  100mL OMNIPAQUE IOHEXOL 350 MG/ML SOLN

[Series 8: cor · coronal · 0.74mm/px · 3 of 159 slices shown]
[im 40/159  soft-tissue]
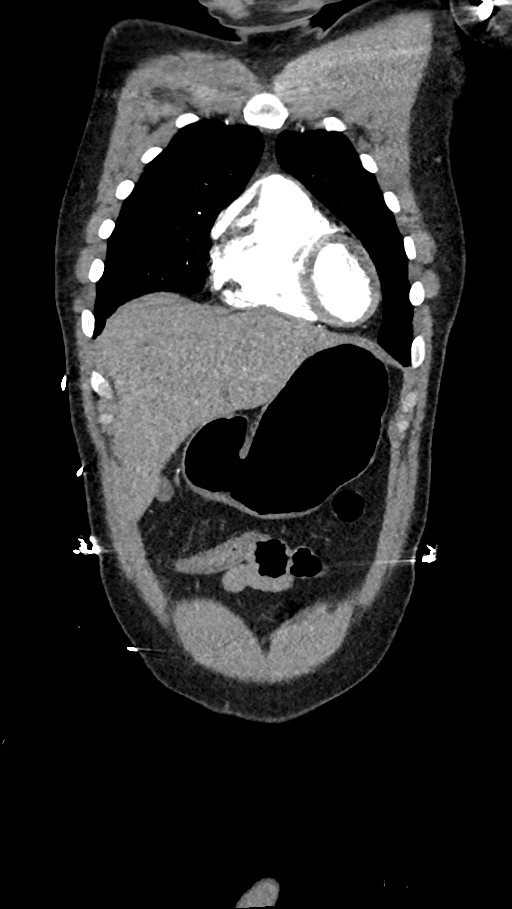
[im 80/159  soft-tissue]
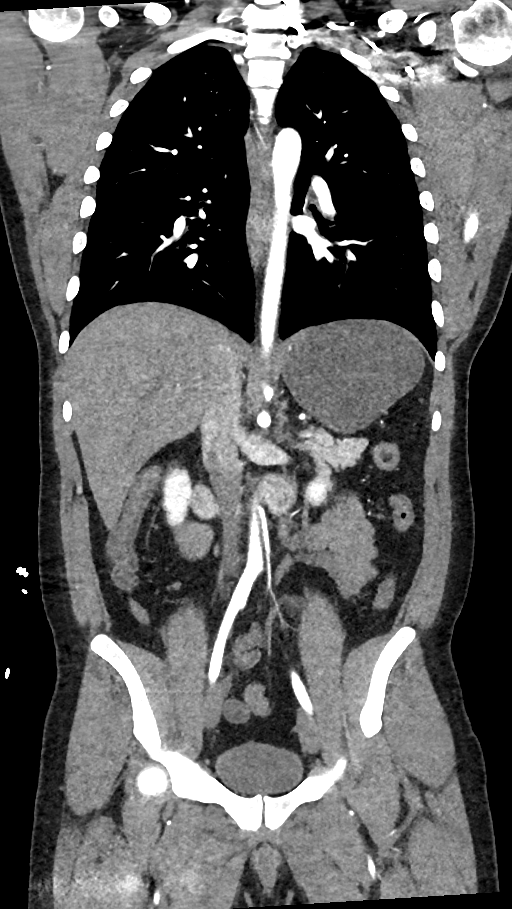
[im 119/159  soft-tissue]
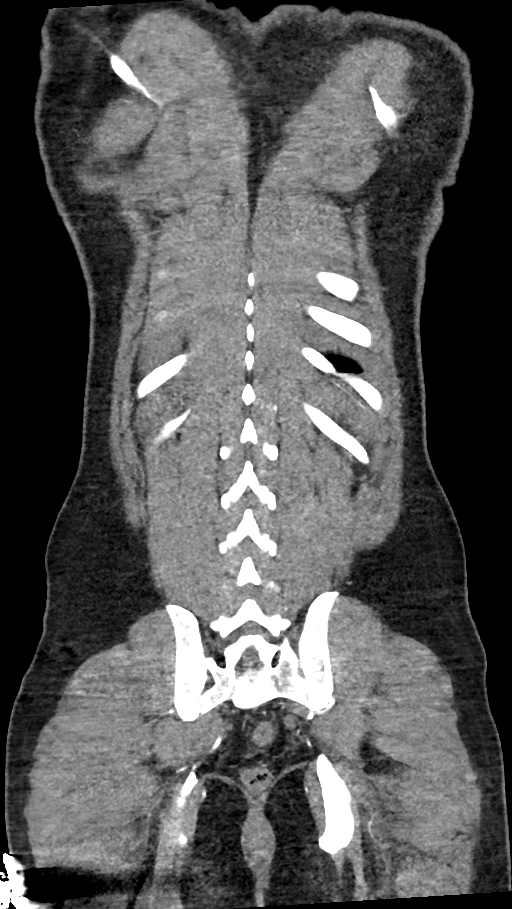

[Series 12: arterial · axial · arterial · 0.74mm/px · z∈[+824,+1388]mm · 9 of 336 slices shown, 11 images]
[im 36/336  soft-tissue]
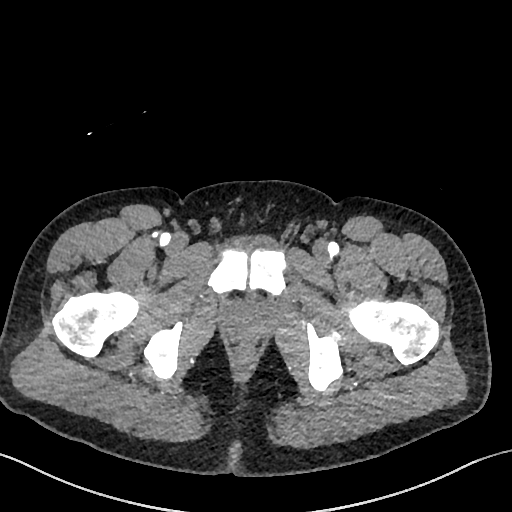
[im 36/336  bone]
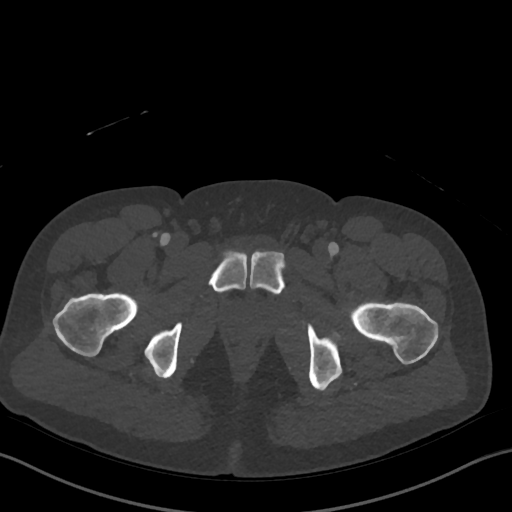
[im 71/336  soft-tissue]
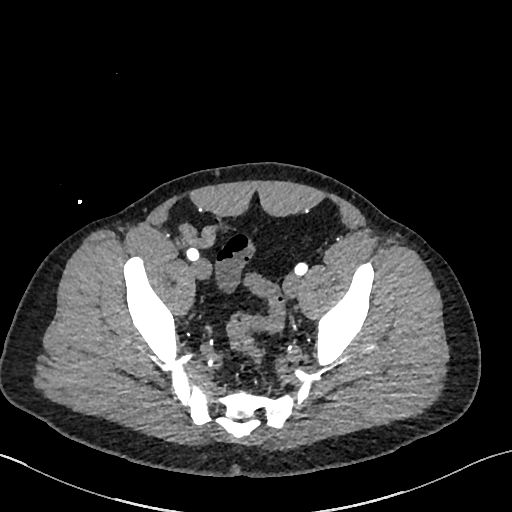
[im 106/336  soft-tissue]
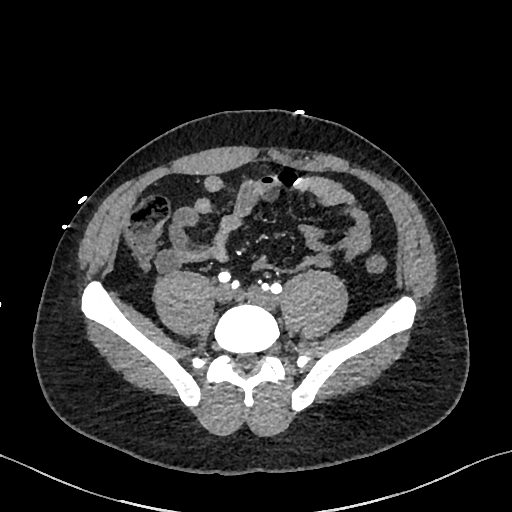
[im 142/336  soft-tissue]
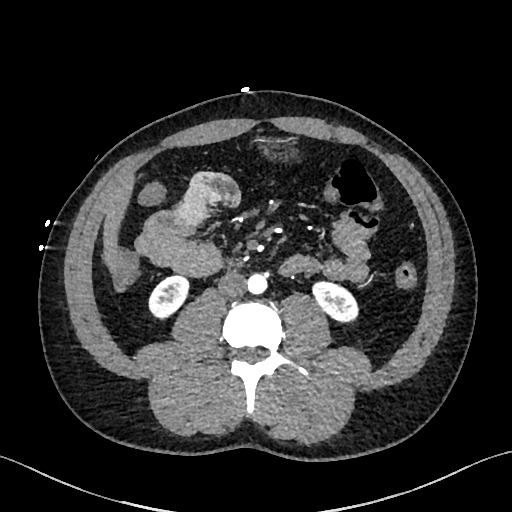
[im 177/336  soft-tissue]
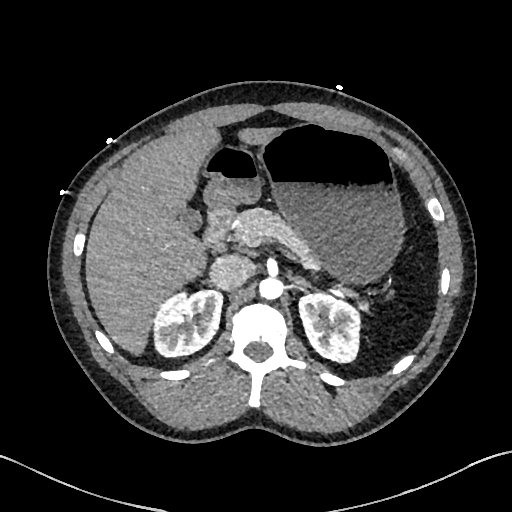
[im 212/336  soft-tissue]
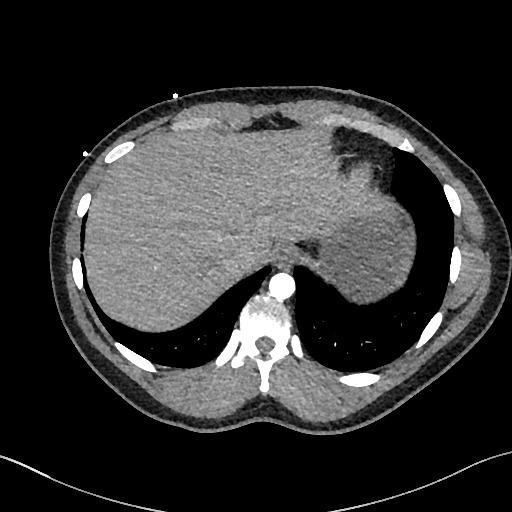
[im 247/336  soft-tissue]
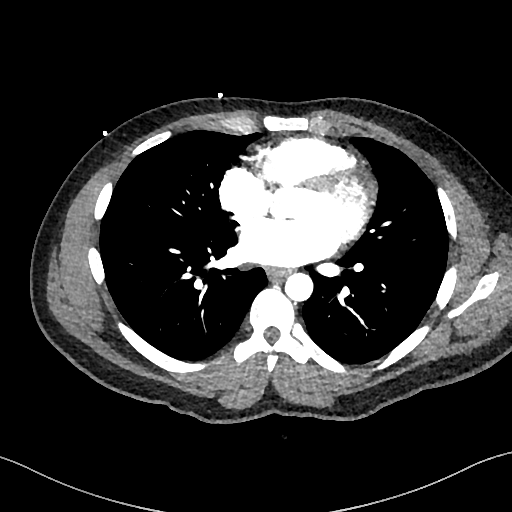
[im 283/336  soft-tissue]
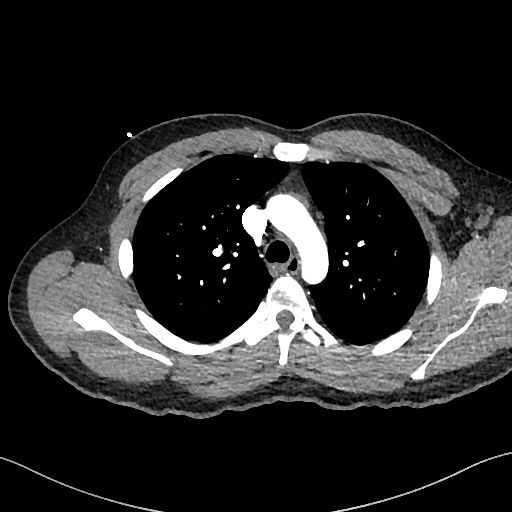
[im 318/336  soft-tissue]
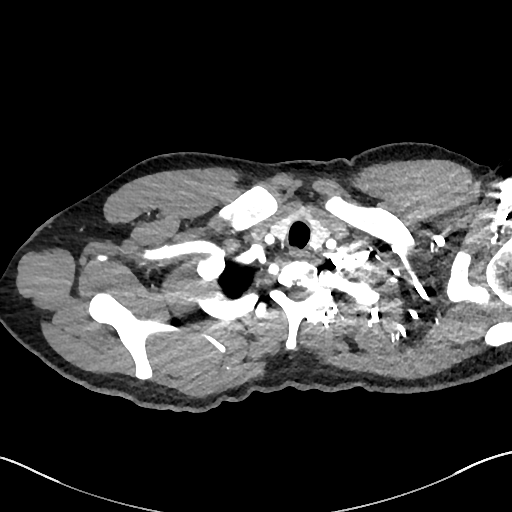
[im 318/336  bone]
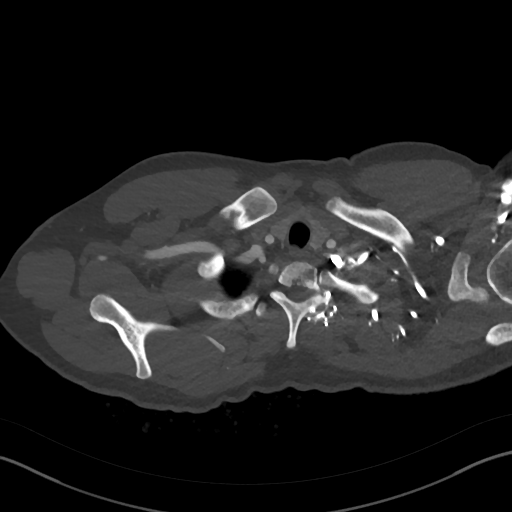

[12 of 46 positions shown; findings below may reference images not displayed]

FINDINGS: CTA CHEST FINDINGS

Cardiovascular: There is no intramural hematoma within the thoracic
aorta on the noncontrast enhanced study. There is no demonstrable
thoracic aortic aneurysm or dissection. The visualized great vessels
appear unremarkable. Note that the right innominate and left common
carotid arteries arise as a common trunk, an anatomic variant. No
pulmonary embolus is appreciable. There is no pericardial effusion
or pericardial thickening evident.

Mediastinum/Nodes: Thyroid appears unremarkable. There is an
enlarged right hilar lymph node measuring 1.4 x 1.4 cm. There are
scattered subcentimeter mediastinal lymph nodes and axillary lymph
nodes. No other lymph node enlargement is evident. There is a small
hiatal hernia.

Lungs/Pleura: There is mild bibasilar atelectasis. No pneumothorax.
No edema or consolidation. No pleural effusion or pleural thickening
evident.

Musculoskeletal: There are no blastic or lytic bone lesions. No
evident fracture or dislocation. No chest wall lesions evident.

Review of the MIP images confirms the above findings.

CTA ABDOMEN AND PELVIS FINDINGS

VASCULAR

Aorta: There is no abdominal aortic aneurysm or dissection. No
appreciable atherosclerotic change.

Celiac: Celiac artery and its branches appear widely patent. No
aneurysm or dissection evident.

SMA: Superior mesenteric artery and its branches appear widely
patent. No aneurysm or dissection evident.

Renals: There are 2 renal arteries on each side, with one of the
arteries on each side significantly larger than the other. Renal
arteries bilaterally appear widely patent in all segments. No
aneurysm or dissection. No fibromuscular dysplasia evident on either
side.

IMA: Inferior mesenteric artery in its branches are widely patent.
No aneurysm or dissection evident.

Inflow: The major pelvic arterial vessels are widely patent
throughout their respective courses. No appreciable atherosclerotic
change noted. No aneurysm or dissection involving major pelvic
arterial vessels. Proximal superficial and profunda femoral arteries
are also widely patent without aneurysm or dissection.

Veins: No obvious venous abnormality within the limitations of this
arterial phase study.

Review of the MIP images confirms the above findings.

NON-VASCULAR

Hepatobiliary: No focal liver lesions are evident. The gallbladder
wall is not appreciably thickened. There is no biliary duct
dilatation.

Pancreas: There is no pancreatic mass or inflammatory focus.

Spleen: No splenic lesions are evident.

Adrenals/Urinary Tract: Adrenals bilaterally appear normal. Kidneys
bilaterally show no evident mass or hydronephrosis on either side.
There is no appreciable renal or ureteral calculus. Urinary bladder
is midline with wall thickness within normal limits.

Stomach/Bowel: There is no appreciable bowel wall or mesenteric
thickening. No evident bowel obstruction. Terminal ileum appears
unremarkable. No free air or portal venous air evident.

Lymphatic: There is no evident adenopathy in the abdomen or pelvis.

Reproductive: Prostate and seminal vesicles appear normal in size
and contour. No evident pelvic mass.

Other: The appendix appears normal. There is no abscess or ascites
in the abdomen or pelvis.

Musculoskeletal: No evident fracture or dislocation. No blastic or
lytic bone lesions. No intramuscular or abdominal wall lesions
evident.

Review of the MIP images confirms the above findings.
IMPRESSION: CT angiogram chest:

1. No thoracic aortic aneurysm or dissection. No intramural
hematoma.

2.  No demonstrable pulmonary embolus.

3.  Areas of mild atelectasis.  No lung edema or consolidation.

4. Enlarged right hilar lymph node. No other adenopathy evident.
Etiology for this single enlarged lymph node is uncertain.

5.  Small hiatal hernia.

CT angiogram abdomen; CT angiogram pelvis:

1. No aneurysm or dissection involving the aorta, major pelvic, or
major mesenteric vessels. No appreciable obstructive disease. No
appreciable atherosclerotic calcification. No fibromuscular
dysplasia.
2. No evident bowel wall thickening or bowel obstruction. No abscess
in the abdomen pelvis. Appendix appears normal.
3. No renal or ureteral calculus. No hydronephrosis on either side.
Urinary bladder wall thickness is within normal limits.
